# Patient Record
Sex: Female | Born: 1960 | Race: White | Hispanic: No | Smoking: Former smoker
Health system: Southern US, Community
[De-identification: ages and names within clinical notes are randomized; demographics above are authoritative.]

## PROBLEM LIST (undated history)

## (undated) ENCOUNTER — Ambulatory Visit: Admission: EM | Payer: 59

## (undated) DIAGNOSIS — Z860100 Personal history of colon polyps, unspecified: Secondary | ICD-10-CM

## (undated) DIAGNOSIS — K219 Gastro-esophageal reflux disease without esophagitis: Secondary | ICD-10-CM

## (undated) DIAGNOSIS — M5412 Radiculopathy, cervical region: Secondary | ICD-10-CM

## (undated) DIAGNOSIS — I10 Essential (primary) hypertension: Secondary | ICD-10-CM

## (undated) DIAGNOSIS — M47814 Spondylosis without myelopathy or radiculopathy, thoracic region: Secondary | ICD-10-CM

## (undated) DIAGNOSIS — E119 Type 2 diabetes mellitus without complications: Secondary | ICD-10-CM

## (undated) DIAGNOSIS — J342 Deviated nasal septum: Secondary | ICD-10-CM

## (undated) DIAGNOSIS — M47816 Spondylosis without myelopathy or radiculopathy, lumbar region: Secondary | ICD-10-CM

## (undated) DIAGNOSIS — E785 Hyperlipidemia, unspecified: Secondary | ICD-10-CM

## (undated) DIAGNOSIS — N83201 Unspecified ovarian cyst, right side: Secondary | ICD-10-CM

## (undated) DIAGNOSIS — A539 Syphilis, unspecified: Secondary | ICD-10-CM

## (undated) HISTORY — DX: Hyperlipidemia, unspecified: E78.5

## (undated) HISTORY — PX: NECK SURGERY: SHX720

## (undated) HISTORY — DX: Type 2 diabetes mellitus without complications: E11.9

## (undated) HISTORY — DX: Essential (primary) hypertension: I10

## (undated) HISTORY — PX: SHOULDER SURGERY: SHX246

## (undated) HISTORY — DX: Personal history of colon polyps, unspecified: Z86.0100

## (undated) HISTORY — DX: Personal history of colonic polyps: Z86.010

---

## 1980-03-27 HISTORY — PX: DILATION AND CURETTAGE OF UTERUS: SHX78

## 1983-03-28 HISTORY — PX: TUBAL LIGATION: SHX77

## 1990-03-27 DIAGNOSIS — A159 Respiratory tuberculosis unspecified: Secondary | ICD-10-CM

## 1990-03-27 HISTORY — DX: Respiratory tuberculosis unspecified: A15.9

## 1994-03-27 HISTORY — PX: KNEE ARTHROSCOPY: SUR90

## 1998-03-27 HISTORY — PX: OVARIAN CYST REMOVAL: SHX89

## 1998-03-27 HISTORY — PX: RIGHT OOPHORECTOMY: SHX2359

## 1998-05-10 HISTORY — PX: CERVICAL SPINE SURGERY: SHX589

## 2016-03-27 DIAGNOSIS — K5130 Ulcerative (chronic) rectosigmoiditis without complications: Secondary | ICD-10-CM

## 2016-03-27 HISTORY — DX: Ulcerative (chronic) rectosigmoiditis without complications: K51.30

## 2017-03-27 HISTORY — PX: HIP ARTHROSCOPY W/ LABRAL REPAIR: SHX1750

## 2018-03-06 DIAGNOSIS — E669 Obesity, unspecified: Secondary | ICD-10-CM | POA: Insufficient documentation

## 2018-03-27 HISTORY — PX: SHOULDER ARTHROSCOPY WITH BICEPSTENOTOMY: SHX6204

## 2019-03-28 HISTORY — PX: SHOULDER ARTHROSCOPY WITH BICEPSTENOTOMY: SHX6204

## 2019-05-28 HISTORY — PX: COLONOSCOPY: SHX174

## 2020-11-17 ENCOUNTER — Other Ambulatory Visit: Payer: Self-pay | Admitting: *Deleted

## 2020-11-17 ENCOUNTER — Other Ambulatory Visit: Payer: Self-pay | Admitting: Family Medicine

## 2020-11-17 ENCOUNTER — Inpatient Hospital Stay
Admission: RE | Admit: 2020-11-17 | Discharge: 2020-11-17 | Disposition: A | Discharge status: Home / Self Care | Payer: Self-pay | Source: Ambulatory Visit | Attending: *Deleted | Admitting: *Deleted

## 2020-11-17 DIAGNOSIS — Z9289 Personal history of other medical treatment: Secondary | ICD-10-CM

## 2020-11-17 DIAGNOSIS — N6452 Nipple discharge: Secondary | ICD-10-CM

## 2020-11-30 ENCOUNTER — Other Ambulatory Visit: Payer: Self-pay

## 2020-11-30 ENCOUNTER — Ambulatory Visit
Admission: RE | Admit: 2020-11-30 | Discharge: 2020-11-30 | Disposition: A | Discharge status: Home / Self Care | Payer: 59 | Source: Ambulatory Visit | Attending: Family Medicine | Admitting: Family Medicine

## 2020-11-30 DIAGNOSIS — N6452 Nipple discharge: Secondary | ICD-10-CM | POA: Diagnosis not present

## 2020-11-30 MED ORDER — GADOBUTROL 1 MMOL/ML IV SOLN
6.0000 mL | Freq: Once | INTRAVENOUS | Status: AC | PRN
  Administered 2020-11-30: 6 mL via INTRAVENOUS

## 2020-12-28 ENCOUNTER — Ambulatory Visit (INDEPENDENT_AMBULATORY_CARE_PROVIDER_SITE_OTHER): Payer: 59 | Admitting: Family Medicine

## 2020-12-28 ENCOUNTER — Other Ambulatory Visit: Payer: Self-pay

## 2020-12-28 ENCOUNTER — Encounter: Payer: Self-pay | Admitting: Family Medicine

## 2020-12-28 VITALS — BP 120/74 | HR 83 | Ht 64.0 in | Wt 150.0 lb

## 2020-12-28 DIAGNOSIS — M5416 Radiculopathy, lumbar region: Secondary | ICD-10-CM

## 2020-12-28 DIAGNOSIS — M47814 Spondylosis without myelopathy or radiculopathy, thoracic region: Secondary | ICD-10-CM | POA: Diagnosis not present

## 2020-12-28 DIAGNOSIS — G8929 Other chronic pain: Secondary | ICD-10-CM | POA: Insufficient documentation

## 2020-12-28 DIAGNOSIS — M7918 Myalgia, other site: Secondary | ICD-10-CM

## 2020-12-28 MED ORDER — DULOXETINE HCL 30 MG PO CPEP: ORAL_CAPSULE | ORAL | 0 refills | Status: DC

## 2020-12-28 NOTE — Patient Instructions (Signed)
-   Start duloxetine, dose 30 mg (1 capsule) nightly x2 weeks - If tolerating well, increase to 60 mg (2 capsules) nightly x2 weeks - Contact our office at the 4-week mark to provide a status update via MyChart or for any questions between now and then - The referral coordinator will contact you to schedule follow-up with pain and spine group to discuss additional treatments (injections, etc.) - We will coordinate a follow-up with our group ideally after you have seen pain and spine

## 2020-12-28 NOTE — Assessment & Plan Note (Signed)
See additional assessment(s) for plan details. 

## 2020-12-28 NOTE — Progress Notes (Signed)
Primary Care / Sports Medicine Office Visit  Patient Information:  Patient ID: Molly Cooke, female DOB: Mar 20, 1961 Age: 60 y.o. MRN: [id number]   Molly Cooke is a pleasant 60 y.o. female presenting with the following:  Chief Complaint  Patient presents with   New Patient (Initial Visit)   Back Pain    Since fall in 2019; did PT for 3 years and was getting epidural injections with [facility] (Dr. Joycelyn Man) with relief; had to switch providers due to insurance change; thoracolumbar constant pain that radiates to left hip; 8/10 pain    Review of Systems pertinent details above   Patient Active Problem List   Diagnosis Date Noted   Thoracic spondylosis without myelopathy 12/28/2020   Chronic musculoskeletal pain 12/28/2020   Chronic left-sided lumbar radiculopathy 12/28/2020   Obesity (BMI 30-39.9) 03/06/2018   Past Medical History:  Diagnosis Date   Diabetes mellitus without complication (HCC)    Hyperlipidemia    Hypertension    Outpatient Encounter Medications as of 12/28/2020  Medication Sig   aspirin 81 MG chewable tablet Chew 81 mg by mouth daily.   atorvastatin (LIPITOR) 10 MG tablet Take 10 mg by mouth at bedtime.   baclofen (LIORESAL) 10 MG tablet Take 10 mg by mouth 3 (three) times daily.   benazepril (LOTENSIN) 40 MG tablet Take 40 mg by mouth daily.   diclofenac Sodium (VOLTAREN) 1 % GEL Apply 2 g topically as needed.   DULoxetine (CYMBALTA) 30 MG capsule Take 1 capsule (30 mg total) by mouth every evening for 14 days, THEN 2 capsules (60 mg total) every evening for 16 days.   glipiZIDE (GLUCOTROL XL) 2.5 MG 24 hr tablet Take 2.5 mg by mouth every morning.   LORazepam (ATIVAN) 0.5 MG tablet Take 0.5 mg by mouth at bedtime.   mesalamine (CANASA) 1000 MG suppository Place 1,000 mg rectally at bedtime as needed.   omeprazole (PRILOSEC) 40 MG capsule Take 40 mg by mouth daily.   OZEMPIC, 1 MG/DOSE, 4 MG/3ML SOPN Inject 1 mg into the skin once a week.    [DISCONTINUED] sulfaSALAzine (AZULFIDINE) 500 MG tablet Take 500 mg by mouth daily.   gabapentin (NEURONTIN) 100 MG capsule Take 100 mg by mouth every 6 (six) hours as needed. (Patient not taking: Reported on 12/28/2020)   mesalamine (LIALDA) 1.2 g EC tablet Take 1.2 g by mouth daily. (Patient not taking: Reported on 12/28/2020)   metFORMIN (GLUCOPHAGE) 1000 MG tablet Take 1,000 mg by mouth in the morning and at bedtime. (Patient not taking: Reported on 12/28/2020)   traMADol (ULTRAM) 50 MG tablet Take 50 mg by mouth every 6 (six) hours as needed. (Patient not taking: Reported on 12/28/2020)   [DISCONTINUED] chlorzoxazone (PARAFON) 500 MG tablet Take 500 mg by mouth 3 (three) times daily as needed.   No facility-administered encounter medications on file as of 12/28/2020.   Past Surgical History:  Procedure Laterality Date   HIP ARTHROSCOPY W/ LABRAL REPAIR Left 2019   KNEE ARTHROSCOPY Right 1996   OVARIAN CYST REMOVAL Right 2000   SHOULDER ARTHROSCOPY WITH BICEPSTENOTOMY Left 2020   SHOULDER ARTHROSCOPY WITH BICEPSTENOTOMY Left 2021   TUBAL LIGATION  1985    Vitals:   12/28/20 1521  BP: 120/74  Pulse: 83  SpO2: 97%   Vitals:   12/28/20 1521  Weight: 150 lb (68 kg)  Height: 5\' 4"  (1.626 m)   Body mass index is 25.75 kg/m.     Independent interpretation of notes and  tests performed by another provider:   None  Procedures performed:   None  Pertinent History, Exam, Impression, and Recommendations:   Thoracic spondylosis without myelopathy Patient with chronic thoracolumbar pain in the setting of multilevel degenerative changes outlined during outside record review. She has been receiving regular thoracic epidural injections with moderate response and most recently, 01/2020 timeframe, received multiple thoracic facet injections which have afforded several months of symptoms control. She had to discontinue outside pain & spine group due to insurance change and is interested in  reestablishing with another group.  Her examination reveals full range of motion at the thoracic and lumbar spine, pain during rightward torsion, negative axial loading, tenderness left greater than right paraspinal thoracic, otherwise non-tender, positive seated straight leg raise on left.  I have advised treatment strategies and she is amenable to referral to pain & spine group, we placed this today, additionally we initiated duloxetine at 30 mg QHS with titration to 60 mg QHS in 2 weeks if tolerating well. She is to provide a status update at that time for further Rx instructions.  Chronic musculoskeletal pain See additional assessment(s) for plan details.  Chronic left-sided lumbar radiculopathy See additional assessment(s) for plan details.   Orders & Medications Meds ordered this encounter  Medications   DULoxetine (CYMBALTA) 30 MG capsule    Sig: Take 1 capsule (30 mg total) by mouth every evening for 14 days, THEN 2 capsules (60 mg total) every evening for 16 days.    Dispense:  46 capsule    Refill:  0   Orders Placed This Encounter  Procedures   Ambulatory referral to Pain Clinic     Return if symptoms worsen or fail to improve.     Jerrol Banana, MD   Primary Care Sports Medicine [facility]

## 2020-12-28 NOTE — Assessment & Plan Note (Signed)
Patient with chronic thoracolumbar pain in the setting of multilevel degenerative changes outlined during outside record review. She has been receiving regular thoracic epidural injections with moderate response and most recently, 01/2020 timeframe, received multiple thoracic facet injections which have afforded several months of symptoms control. She had to discontinue outside pain & spine group due to insurance change and is interested in reestablishing with another group.  Her examination reveals full range of motion at the thoracic and lumbar spine, pain during rightward torsion, negative axial loading, tenderness left greater than right paraspinal thoracic, otherwise non-tender, positive seated straight leg raise on left.  I have advised treatment strategies and she is amenable to referral to pain & spine group, we placed this today, additionally we initiated duloxetine at 30 mg QHS with titration to 60 mg QHS in 2 weeks if tolerating well. She is to provide a status update at that time for further Rx instructions.

## 2021-01-03 ENCOUNTER — Other Ambulatory Visit: Payer: Self-pay

## 2021-01-03 DIAGNOSIS — G8929 Other chronic pain: Secondary | ICD-10-CM

## 2021-01-03 DIAGNOSIS — M5416 Radiculopathy, lumbar region: Secondary | ICD-10-CM

## 2021-01-03 DIAGNOSIS — M47814 Spondylosis without myelopathy or radiculopathy, thoracic region: Secondary | ICD-10-CM

## 2021-03-15 ENCOUNTER — Other Ambulatory Visit: Payer: Self-pay

## 2021-03-15 ENCOUNTER — Encounter: Payer: Self-pay | Admitting: Student in an Organized Health Care Education/Training Program

## 2021-03-15 ENCOUNTER — Ambulatory Visit
Payer: 59 | Attending: Student in an Organized Health Care Education/Training Program | Admitting: Student in an Organized Health Care Education/Training Program

## 2021-03-15 VITALS — BP 158/101 | HR 81 | Temp 97.2°F | Resp 16 | Ht 63.0 in | Wt 150.0 lb

## 2021-03-15 DIAGNOSIS — G8929 Other chronic pain: Secondary | ICD-10-CM

## 2021-03-15 DIAGNOSIS — G894 Chronic pain syndrome: Secondary | ICD-10-CM

## 2021-03-15 DIAGNOSIS — M47814 Spondylosis without myelopathy or radiculopathy, thoracic region: Secondary | ICD-10-CM | POA: Diagnosis present

## 2021-03-15 DIAGNOSIS — M7918 Myalgia, other site: Secondary | ICD-10-CM | POA: Insufficient documentation

## 2021-03-15 NOTE — Progress Notes (Signed)
Patient: Molly Cooke  Service Category: E/M  Provider: Gillis Santa, MD  DOB: 1960/06/27  DOS: 03/15/2021  Referring Provider: Montel Culver, MD  MRN: [id number]  Setting: Ambulatory outpatient  PCP: Tor Netters, MD  Type: New Patient  Specialty: Interventional Pain Management    Location: Office  Delivery: Face-to-face     Primary Reason(s) for Visit: Encounter for initial evaluation of one or more chronic problems (new to examiner) potentially causing chronic pain, and posing a threat to normal musculoskeletal function. (Level of risk: High) CC: Back Pain (lower)  HPI  Molly Cooke is a 60 y.o. year old, female patient, who comes for the first time to our practice referred by Zigmund Daniel Earley Abide, MD for our initial evaluation of her chronic pain. She has Obesity (BMI 30-39.9); Arthropathy of thoracic facet joint; Chronic musculoskeletal pain; and Chronic left-sided lumbar radiculopathy on their problem list. Today she comes in for evaluation of her Back Pain (lower)  Pain Assessment: Location: Lower Back Radiating: left buttock Onset: More than a month ago Duration: Chronic pain Quality: Sharp, Aching Severity: 7 /10 (subjective, self-reported pain score)  Effect on ADL: sometimes cannot get out of bed, requires frequent, long periods of rest, limited daily activities Timing: Constant Modifying factors: lying on heat, Delta 8 (cannibas), injections BP: (!) 158/101   HR: 81  Onset and Duration: Sudden and Present longer than 3 months Cause of pain: Work related accident or event Severity: NAS-11 at its worse: 10/10, NAS-11 at its best: 2/10, NAS-11 now: 10/10, and NAS-11 on the average: 8/10 Timing: Not influenced by the time of the day, During activity or exercise, and After activity or exercise Aggravating Factors: Bending, Climbing, Kneeling, Lifiting, Motion, Prolonged sitting, Prolonged standing, Squatting, Stooping , Twisting, Walking, Walking uphill, Walking downhill, and  Working Alleviating Factors: Cold packs, Hot packs, Lying down, Nerve blocks, Resting, and Warm showers or baths Associated Problems: Inability to concentrate, Personality changes, Sadness, Tingling, Weakness, Pain that wakes patient up, and Pain that does not allow patient to sleep Quality of Pain: Aching, Agonizing, Burning, Deep, Disabling, Exhausting, Horrible, Nagging, Sharp, Stabbing, Tender, Throbbing, Tingling, Tiring, and Work related Previous Examinations or Tests: MRI scan, Nerve block, X-rays, and Nerve conduction test Previous Treatments: Chiropractic manipulations, Epidural steroid injections, Narcotic medications, Physical Therapy, Pool exercises, Relaxation therapy, Steroid treatments by mouth, Stretching exercises, TENS, Traction, and Trigger point injections  Molly Cooke is a pleasant 60 year old female who presents with mid and low back pain related to thoracic facet joint syndrome, thoracic spondylosis.  This started back in 2019 when she sustained a fall.  She has done physical therapy for 3 years in the past and tries to continue stretching exercises at home.  She has constant thoracolumbar pain that occasionally radiates into her left hip.  She has been seen evaluated by [facility] in the past.  X-rays revealed thoracic facet arthropathy and degenerative joint disease.  She has found benefit from thoracic facet joint injections in the past with Dr. Tacy Dura at [facility].  She states that these provided 4 to 6 months of pain relief and is hoping to have these repeated.  She utilizes delta H to help manage her pain.  She states that she has issues with memory and recall.  She states that the delta agent does help her pain.  She tries to do stretching exercises at home and has tried a TENS unit in the past.  She also utilizes baclofen and Voltaren gel.  Trial of Cymbalta which she discontinued.  She has also tried gabapentin in the past 100 mg every 6 hours with limited response.  Meds    Current Outpatient Medications:    atorvastatin (LIPITOR) 10 MG tablet, Take 10 mg by mouth at bedtime., Disp: , Rfl:    baclofen (LIORESAL) 10 MG tablet, Take 10 mg by mouth 3 (three) times daily., Disp: , Rfl:    benazepril (LOTENSIN) 40 MG tablet, Take 40 mg by mouth daily., Disp: , Rfl:    diclofenac Sodium (VOLTAREN) 1 % GEL, Apply 2 g topically as needed., Disp: , Rfl:    glipiZIDE (GLUCOTROL XL) 2.5 MG 24 hr tablet, Take 2.5 mg by mouth every morning., Disp: , Rfl:    LORazepam (ATIVAN) 0.5 MG tablet, Take 0.5 mg by mouth at bedtime., Disp: , Rfl:    mesalamine (CANASA) 1000 MG suppository, Place 1,000 mg rectally at bedtime as needed., Disp: , Rfl:    metFORMIN (GLUCOPHAGE) 1000 MG tablet, Take 1,000 mg by mouth in the morning and at bedtime., Disp: , Rfl:    omeprazole (PRILOSEC) 40 MG capsule, Take 40 mg by mouth daily., Disp: , Rfl:    OZEMPIC, 1 MG/DOSE, 4 MG/3ML SOPN, Inject 1 mg into the skin once a week., Disp: , Rfl:    DULoxetine (CYMBALTA) 30 MG capsule, Take 1 capsule (30 mg total) by mouth every evening for 14 days, THEN 2 capsules (60 mg total) every evening for 16 days., Disp: 46 capsule, Rfl: 0   gabapentin (NEURONTIN) 100 MG capsule, Take 100 mg by mouth every 6 (six) hours as needed. (Patient not taking: Reported on 12/28/2020), Disp: , Rfl:    mesalamine (LIALDA) 1.2 g EC tablet, Take 1.2 g by mouth daily. (Patient not taking: Reported on 12/28/2020), Disp: , Rfl:    traMADol (ULTRAM) 50 MG tablet, Take 50 mg by mouth every 6 (six) hours as needed. (Patient not taking: Reported on 12/28/2020), Disp: , Rfl:   Imaging Review  Thoracic x-ray note from [facility] reveals thoracic facet joint syndrome most pronounced at T8-T9 and T9-T10 bilaterally. Complexity Note: Imaging results reviewed. Results shared with Molly Cooke, using Layman's terms.                         ROS  Cardiovascular: High blood pressure Pulmonary or Respiratory: Exposure to  tuberculosis Neurological: No reported neurological signs or symptoms such as seizures, abnormal skin sensations, urinary and/or fecal incontinence, being born with an abnormal open spine and/or a tethered spinal cord Psychological-Psychiatric: Anxiousness Gastrointestinal: Vomiting blood (Ulcers) and Reflux or heatburn Genitourinary: No reported renal or genitourinary signs or symptoms such as difficulty voiding or producing urine, peeing blood, non-functioning kidney, kidney stones, difficulty emptying the bladder, difficulty controlling the flow of urine, or chronic kidney disease Hematological: No reported hematological signs or symptoms such as prolonged bleeding, low or poor functioning platelets, bruising or bleeding easily, hereditary bleeding problems, low energy levels due to low hemoglobin or being anemic Endocrine: High blood sugar controlled without the use of insulin (NIDDM) Rheumatologic: No reported rheumatological signs and symptoms such as fatigue, joint pain, tenderness, swelling, redness, heat, stiffness, decreased range of motion, with or without associated rash Musculoskeletal: Negative for myasthenia gravis, muscular dystrophy, multiple sclerosis or malignant hyperthermia Work History: Disabled and Out of work due to pain  Allergies  Ms. Ohlrich is allergic to hydrocodone-acetaminophen, nsaids, oxycodone-acetaminophen, clemastine, and hydromorphone.  Laboratory Chemistry Profile   Renal No results found for: BUN, CREATININE, LABCREA, BCR, GFR,  GFRAA, GFRNONAA, SPECGRAV, PHUR, PROTEINUR   Electrolytes No results found for: NA, K, CL, CALCIUM, MG, PHOS   Hepatic No results found for: AST, ALT, ALBUMIN, ALKPHOS, AMYLASE, LIPASE, AMMONIA   ID No results found for: LYMEIGGIGMAB, HIV, SARSCOV2NAA, STAPHAUREUS, MRSAPCR, HCVAB, PREGTESTUR, RMSFIGG, QFVRPH1IGG, QFVRPH2IGG, LYMEIGGIGMAB   Bone No results found for: VD25OH, [id number], [id number], [id number], 25OHVITD1, 25OHVITD2,  25OHVITD3, TESTOFREE, TESTOSTERONE   Endocrine No results found for: GLUCOSE, GLUCOSEU, HGBA1C, TSH, FREET4, TESTOFREE, TESTOSTERONE, SHBG, ESTRADIOL, ESTRADIOLPCT, ESTRADIOLFRE, LABPREG, ACTH, CRTSLPL, UCORFRPERLTR, UCORFRPERDAY, CORTISOLBASE, LABPREG   Neuropathy No results found for: VITAMINB12, FOLATE, HGBA1C, HIV   CNS No results found for: COLORCSF, APPEARCSF, RBCCOUNTCSF, WBCCSF, POLYSCSF, LYMPHSCSF, EOSCSF, PROTEINCSF, GLUCCSF, JCVIRUS, CSFOLI, IGGCSF, LABACHR, ACETBL, LABACHR, ACETBL   Inflammation (CRP: Acute   ESR: Chronic) No results found for: CRP, ESRSEDRATE, LATICACIDVEN   Rheumatology No results found for: RF, ANA, LABURIC, URICUR, LYMEIGGIGMAB, LYMEABIGMQN, HLAB27   Coagulation No results found for: INR, LABPROT, APTT, PLT, DDIMER, LABHEMA, VITAMINK1, AT3   Cardiovascular No results found for: BNP, CKTOTAL, CKMB, TROPONINI, HGB, HCT, LABVMA, EPIRU, EPINEPH24HUR, NOREPRU, NOREPI24HUR, DOPARU, DOPAM24HRUR   Screening No results found for: SARSCOV2NAA, COVIDSOURCE, STAPHAUREUS, MRSAPCR, HCVAB, HIV, PREGTESTUR   Cancer No results found for: CEA, CA125, LABCA2   Allergens No results found for: ALMOND, APPLE, ASPARAGUS, AVOCADO, BANANA, BARLEY, BASIL, BAYLEAF, GREENBEAN, LIMABEAN, WHITEBEAN, BEEFIGE, REDBEET, BLUEBERRY, BROCCOLI, CABBAGE, MELON, CARROT, CASEIN, CASHEWNUT, CAULIFLOWER, CELERY     Note: Lab results reviewed.  PFSH  Drug: Ms. Beaufort  reports no history of drug use. Alcohol:  reports current alcohol use of about 2.0 standard drinks per week. Tobacco:  reports that she quit smoking about 14 years ago. Her smoking use included cigarettes. She has never used smokeless tobacco. Medical:  has a past medical history of Diabetes mellitus without complication ([place]), Hyperlipidemia, and Hypertension. Family: family history includes Dementia in her mother.  Past Surgical History:  Procedure Laterality Date   HIP ARTHROSCOPY W/ LABRAL REPAIR Left 2019   KNEE  ARTHROSCOPY Right 1996   OVARIAN CYST REMOVAL Right 2000   SHOULDER ARTHROSCOPY WITH BICEPSTENOTOMY Left 2020   SHOULDER ARTHROSCOPY WITH BICEPSTENOTOMY Left 2021   TUBAL LIGATION  1985   Active Ambulatory Problems    Diagnosis Date Noted   Obesity (BMI 30-39.9) 03/06/2018   Arthropathy of thoracic facet joint 12/28/2020   Chronic musculoskeletal pain 12/28/2020   Chronic left-sided lumbar radiculopathy 12/28/2020   Resolved Ambulatory Problems    Diagnosis Date Noted   No Resolved Ambulatory Problems   Past Medical History:  Diagnosis Date   Diabetes mellitus without complication ([place])    Hyperlipidemia    Hypertension    Constitutional Exam  General appearance: Well nourished, well developed, and well hydrated. In no apparent acute distress Vitals:   03/15/21 0858 03/15/21 0859  BP: (!) 169/111 (!) 158/101  Pulse: 81   Resp: 16   Temp: (!) 97.2 F (36.2 C)   TempSrc: Temporal   SpO2: 100%   Weight: 150 lb (68 kg)   Height: 5' 3" (1.6 m)    BMI Assessment: Estimated body mass index is 26.57 kg/m as calculated from the following:   Height as of this encounter: 5' 3" (1.6 m).   Weight as of this encounter: 150 lb (68 kg).  BMI interpretation table: BMI level Category Range association with higher incidence of chronic pain  <18 kg/m2 Underweight   18.5-24.9 kg/m2 Ideal body weight   25-29.9 kg/m2 Overweight Increased incidence by 20%  30-34.9 kg/m2 Obese (Class I) Increased incidence by 68%  35-39.9 kg/m2 Severe obesity (Class II) Increased incidence by 136%  >40 kg/m2 Extreme obesity (Class III) Increased incidence by 254%   Patient's current BMI Ideal Body weight  Body mass index is 26.57 kg/m. Ideal body weight: 52.4 kg (115 lb 8.3 oz) Adjusted ideal body weight: 58.7 kg (129 lb 5 oz)   BMI Readings from Last 4 Encounters:  03/15/21 26.57 kg/m  12/28/20 25.75 kg/m   Wt Readings from Last 4 Encounters:  03/15/21 150 lb (68 kg)  12/28/20 150 lb (68 kg)     Psych/Mental status: Alert, oriented x 3 (person, place, & time)       Eyes: PERLA Respiratory: No evidence of acute respiratory distress  Thoracic Spine Area Exam  Skin & Axial Inspection: No masses, redness, or swelling Alignment: Symmetrical Functional ROM: Pain restricted ROM Stability: No instability detected Muscle Tone/Strength: Functionally intact. No obvious neuro-muscular anomalies detected. Sensory (Neurological): Musculoskeletal pain pattern Muscle strength & Tone: No palpable anomalies Lumbar Spine Area Exam  Skin & Axial Inspection: No masses, redness, or swelling Alignment: Symmetrical Functional ROM: Pain restricted ROM       Stability: No instability detected Muscle Tone/Strength: Functionally intact. No obvious neuro-muscular anomalies detected. Sensory (Neurological): Musculoskeletal pain pattern  Gait & Posture Assessment  Ambulation: Unassisted Gait: Relatively normal for age and body habitus Posture: WNL  Lower Extremity Exam    Side: Right lower extremity  Side: Left lower extremity  Stability: No instability observed          Stability: No instability observed          Skin & Extremity Inspection: Skin color, temperature, and hair growth are WNL. No peripheral edema or cyanosis. No masses, redness, swelling, asymmetry, or associated skin lesions. No contractures.  Skin & Extremity Inspection: Skin color, temperature, and hair growth are WNL. No peripheral edema or cyanosis. No masses, redness, swelling, asymmetry, or associated skin lesions. No contractures.  Functional ROM: Unrestricted ROM                  Functional ROM: Unrestricted ROM                  Muscle Tone/Strength: Functionally intact. No obvious neuro-muscular anomalies detected.  Muscle Tone/Strength: Functionally intact. No obvious neuro-muscular anomalies detected.  Sensory (Neurological): Unimpaired        Sensory (Neurological): Unimpaired        DTR: Patellar: deferred  today Achilles: deferred today Plantar: deferred today  DTR: Patellar: deferred today Achilles: deferred today Plantar: deferred today  Palpation: No palpable anomalies  Palpation: No palpable anomalies    Assessment  Primary Diagnosis & Pertinent Problem List: The primary encounter diagnosis was Thoracic spondylosis without myelopathy. Diagnoses of Arthropathy of thoracic facet joint, Chronic musculoskeletal pain, and Chronic pain syndrome were also pertinent to this visit.  Visit Diagnosis (New problems to examiner): 1. Thoracic spondylosis without myelopathy   2. Arthropathy of thoracic facet joint   3. Chronic musculoskeletal pain   4. Chronic pain syndrome    Plan of Care (Initial workup plan)  Thoracic spondylosis without myelopathy - Plan: THORACIC FACET BLOCK  Arthropathy of thoracic facet joint - Plan: THORACIC FACET BLOCK  Chronic musculoskeletal pain  Chronic pain syndrome - Plan: THORACIC FACET BLOCK   Procedure Orders         THORACIC FACET BLOCK     Discussed thoracic facet medial branch nerve block which the patient has  had in the past that provided 4 to 6 months of pain relief.  Patient provide [place] procedural records from Dr. Garth Schlatter office detailing procedural history.  We will plan on doing this under fluoroscopy.  Also discussed radiofrequency ablation of the thoracic medial branch nerves based upon response from thoracic medial branch nerve blocks.  Provider-requested follow-up: Return in about 15 days (around 03/30/2021) for Thoracic Fct Block , without sedation.  I spent a total of 60 minutes reviewing chart data, face-to-face evaluation with the patient, counseling and coordination of care as detailed above.   No future appointments.  Note by: Gillis Santa, MD Date: 03/15/2021; Time: 9:30 AM

## 2021-03-15 NOTE — Patient Instructions (Signed)
____________________________________________________________________________________________  Preparing for your procedure (without sedation)  Procedure appointments are limited to planned procedures: . No Prescription Refills. . No disability issues will be discussed. . No medication changes will be discussed.  Instructions: . Oral Intake: Do not eat or drink anything for at least 6 hours prior to your procedure. (Exception: Blood Pressure Medication. See below.) . Transportation: Unless otherwise stated by your physician, you may drive yourself after the procedure. . Blood Pressure Medicine: Do not forget to take your blood pressure medicine with a sip of water the morning of the procedure. If your Diastolic (lower reading)is above 100 mmHg, elective cases will be cancelled/rescheduled. . Blood thinners: These will need to be stopped for procedures. Notify our staff if you are taking any blood thinners. Depending on which one you take, there will be specific instructions on how and when to stop it. . Diabetics on insulin: Notify the staff so that you can be scheduled 1st case in the morning. If your diabetes requires high dose insulin, take only  of your normal insulin dose the morning of the procedure and notify the staff that you have done so. . Preventing infections: Shower with an antibacterial soap the morning of your procedure.  . Build-up your immune system: Take 1000 mg of Vitamin C with every meal (3 times a day) the day prior to your procedure. . Antibiotics: Inform the staff if you have a condition or reason that requires you to take antibiotics before dental procedures. . Pregnancy: If you are pregnant, call and cancel the procedure. . Sickness: If you have a cold, fever, or any active infections, call and cancel the procedure. . Arrival: You must be in the facility at least 30 minutes prior to your scheduled procedure. . Children: Do not bring any children with you. . Dress  appropriately: Bring dark clothing that you would not mind if they get stained. . Valuables: Do not bring any jewelry or valuables.  Reasons to call and reschedule or cancel your procedure: (Following these recommendations will minimize the risk of a serious complication.) . Surgeries: Avoid having procedures within 2 weeks of any surgery. (Avoid for 2 weeks before or after any surgery). . Flu Shots: Avoid having procedures within 2 weeks of a flu shots or . (Avoid for 2 weeks before or after immunizations). . Barium: Avoid having a procedure within 7-10 days after having had a radiological study involving the use of radiological contrast. (Myelograms, Barium swallow or enema study). . Heart attacks: Avoid any elective procedures or surgeries for the initial 6 months after a "Myocardial Infarction" (Heart Attack). . Blood thinners: It is imperative that you stop these medications before procedures. Let us know if you if you take any blood thinner.  . Infection: Avoid procedures during or within two weeks of an infection (including chest colds or gastrointestinal problems). Symptoms associated with infections include: Localized redness, fever, chills, night sweats or profuse sweating, burning sensation when voiding, cough, congestion, stuffiness, runny nose, sore throat, diarrhea, nausea, vomiting, cold or Flu symptoms, recent or current infections. It is specially important if the infection is over the area that we intend to treat. . Heart and lung problems: Symptoms that may suggest an active cardiopulmonary problem include: cough, chest pain, breathing difficulties or shortness of breath, dizziness, ankle swelling, uncontrolled high or unusually low blood pressure, and/or palpitations. If you are experiencing any of these symptoms, cancel your procedure and contact your primary care physician for an evaluation.  Remember:  Regular   Business hours are:  Monday to Thursday 8:00 AM to 4:00  PM  Provider's Schedule: Molly Naveira, MD:  Procedure days: Tuesday and Thursday 7:30 AM to 4:00 PM  Molly Lateef, MD:  Procedure days: Monday and Wednesday 7:30 AM to 4:00 PM ____________________________________________________________________________________________    

## 2021-03-15 NOTE — Progress Notes (Signed)
Safety precautions to be maintained throughout the outpatient stay will include: orient to surroundings, keep bed in low position, maintain call bell within reach at all times, provide assistance with transfer out of bed and ambulation.  

## 2021-03-30 ENCOUNTER — Encounter: Payer: Self-pay | Admitting: Student in an Organized Health Care Education/Training Program

## 2021-03-30 ENCOUNTER — Other Ambulatory Visit: Payer: Self-pay

## 2021-03-30 ENCOUNTER — Ambulatory Visit (HOSPITAL_BASED_OUTPATIENT_CLINIC_OR_DEPARTMENT_OTHER): Payer: 59 | Admitting: Student in an Organized Health Care Education/Training Program

## 2021-03-30 ENCOUNTER — Ambulatory Visit
Admission: RE | Admit: 2021-03-30 | Discharge: 2021-03-30 | Disposition: A | Discharge status: Home / Self Care | Payer: 59 | Source: Ambulatory Visit | Attending: Student in an Organized Health Care Education/Training Program | Admitting: Student in an Organized Health Care Education/Training Program

## 2021-03-30 DIAGNOSIS — G894 Chronic pain syndrome: Secondary | ICD-10-CM | POA: Insufficient documentation

## 2021-03-30 DIAGNOSIS — M47814 Spondylosis without myelopathy or radiculopathy, thoracic region: Secondary | ICD-10-CM | POA: Diagnosis not present

## 2021-03-30 MED ORDER — ROPIVACAINE HCL 2 MG/ML IJ SOLN
INTRAMUSCULAR | Status: AC
  Filled 2021-03-30: qty 20

## 2021-03-30 MED ORDER — LIDOCAINE HCL 2 % IJ SOLN
20.0000 mL | Freq: Once | INTRAMUSCULAR | Status: AC
  Administered 2021-03-30: 400 mg

## 2021-03-30 MED ORDER — DEXAMETHASONE SODIUM PHOSPHATE 10 MG/ML IJ SOLN
10.0000 mg | Freq: Once | INTRAMUSCULAR | Status: AC
  Administered 2021-03-30: 10 mg

## 2021-03-30 MED ORDER — DEXAMETHASONE SODIUM PHOSPHATE 10 MG/ML IJ SOLN
INTRAMUSCULAR | Status: AC
  Filled 2021-03-30: qty 2

## 2021-03-30 MED ORDER — LIDOCAINE HCL 2 % IJ SOLN
INTRAMUSCULAR | Status: AC
  Filled 2021-03-30: qty 20

## 2021-03-30 MED ORDER — ROPIVACAINE HCL 2 MG/ML IJ SOLN
9.0000 mL | Freq: Once | INTRAMUSCULAR | Status: AC
  Administered 2021-03-30: 20 mL via PERINEURAL

## 2021-03-30 NOTE — Progress Notes (Signed)
PROVIDER NOTE: Interpretation of information contained herein should be left to medically-trained personnel. Specific patient instructions are provided elsewhere under "Patient Instructions" section of medical record. This document was created in part using STT-dictation technology, any transcriptional errors that may result from this process are unintentional.  Patient: Molly Cooke Type: Established DOB: 07/21/1960 MRN: [id number] PCP: Babs Bertin, MD  Service: Procedure DOS: 03/30/2021 Setting: Ambulatory Location: Ambulatory outpatient facility Delivery: Face-to-face Provider: Edward Jolly, MD Specialty: Interventional Pain Management Specialty designation: 09 Location: Outpatient facility Ref. Prov.: Edward Jolly, MD    Primary Reason for Visit: Interventional Pain Management Treatment. CC: Back Pain (Left and mid)    Procedure:          Anesthesia, Analgesia, Anxiolysis:  Type: Therapeutic Medial Branch Facet Block  #1  Region:Thoracic Level: T9, T10, T11 Medial Branch Level(s) Laterality: Bilateral  Anesthesia: Local (1-2% Lidocaine)  Anxiolysis: None  Sedation: None  Guidance: Fluoroscopy           Position: Prone   1. Thoracic spondylosis without myelopathy   2. Arthropathy of thoracic facet joint   3. Chronic pain syndrome    NAS-11 Pain score:   Pre-procedure: 8 /10   Post-procedure: 0-No pain/10     Pre-op H&P Assessment:  Ms. Molly Cooke is a 61 y.o. (year old), female patient, seen today for interventional treatment. She  has a past surgical history that includes Shoulder arthroscopy with bicepstenotomy (Left, 2020); Shoulder arthroscopy with bicepstenotomy (Left, 2021); Hip arthroscopy w/ labral repair (Left, 2019); Ovarian cyst removal (Right, 2000); Knee arthroscopy (Right, 1996); and Tubal ligation (1985). Ms. Molly Cooke has a current medication list which includes the following prescription(s): atorvastatin, benazepril, diclofenac sodium, glipizide, lorazepam,  mesalamine, omeprazole, ozempic (1 mg/dose), baclofen, duloxetine, gabapentin, mesalamine, metformin, and tramadol. Her primarily concern today is the Back Pain (Left and mid)  Initial Vital Signs:  Pulse/HCG Rate: 80ECG Heart Rate: 61 Temp:  (!) 97.3 F (36.3 C) Resp: 18 BP: 133/83 SpO2: 100 %  BMI: Estimated body mass index is 25.75 kg/m as calculated from the following:   Height as of this encounter: 5\' 4"  (1.626 m).   Weight as of this encounter: 150 lb (68 kg).  Risk Assessment: Allergies: Reviewed. She is allergic to hydrocodone-acetaminophen, nsaids, oxycodone-acetaminophen, clemastine, and hydromorphone.  Allergy Precautions: None required Coagulopathies: Reviewed. None identified.  Blood-thinner therapy: None at this time Active Infection(s): Reviewed. None identified. Ms. is afebrile  Site Confirmation: Ms. Molly Cooke was asked to confirm the procedure and laterality before marking the site Procedure checklist: Completed Consent: Before the procedure and under the influence of no sedative(s), amnesic(s), or anxiolytics, the patient was informed of the treatment options, risks and possible complications. To fulfill our ethical and legal obligations, as recommended by the American Medical Association's Code of Ethics, I have informed the patient of my clinical impression; the nature and purpose of the treatment or procedure; the risks, benefits, and possible complications of the intervention; the alternatives, including doing nothing; the risk(s) and benefit(s) of the alternative treatment(s) or procedure(s); and the risk(s) and benefit(s) of doing nothing. The patient was provided information about the general risks and possible complications associated with the procedure. These may include, but are not limited to: failure to achieve desired goals, infection, bleeding, organ or nerve damage, allergic reactions, paralysis, and death. In addition, the patient was informed of those  risks and complications associated to Spine-related procedures, such as failure to decrease pain; infection (i.e.: Meningitis, epidural or intraspinal abscess); bleeding (i.e.: epidural  hematoma, subarachnoid hemorrhage, or any other type of intraspinal or peri-dural bleeding); organ or nerve damage (i.e.: Any type of peripheral nerve, nerve root, or spinal cord injury) with subsequent damage to sensory, motor, and/or autonomic systems, resulting in permanent pain, numbness, and/or weakness of one or several areas of the body; allergic reactions; (i.e.: anaphylactic reaction); and/or death. Furthermore, the patient was informed of those risks and complications associated with the medications. These include, but are not limited to: allergic reactions (i.e.: anaphylactic or anaphylactoid reaction(s)); adrenal axis suppression; blood sugar elevation that in diabetics may result in ketoacidosis or comma; water retention that in patients with history of congestive heart failure may result in shortness of breath, pulmonary edema, and decompensation with resultant heart failure; weight gain; swelling or edema; medication-induced neural toxicity; particulate matter embolism and blood vessel occlusion with resultant organ, and/or nervous system infarction; and/or aseptic necrosis of one or more joints. Finally, the patient was informed that Medicine is not an exact science; therefore, there is also the possibility of unforeseen or unpredictable risks and/or possible complications that may result in a catastrophic outcome. The patient indicated having understood very clearly. We have given the patient no guarantees and we have made no promises. Enough time was given to the patient to ask questions, all of which were answered to the patient's satisfaction. Ms. Molly [name] has indicated that she wanted to continue with the procedure. Attestation: I, the ordering provider, attest that I have discussed with the patient the benefits,  risks, side-effects, alternatives, likelihood of achieving goals, and potential problems during recovery for the procedure that I have provided informed consent. Date   Time: 03/30/2021  7:50 AM  Pre-Procedure Preparation:  Monitoring: As per clinic protocol. Respiration, ETCO2, SpO2, BP, heart rate and rhythm monitor placed and checked for adequate function Safety Precautions: Patient was assessed for positional comfort and pressure points before starting the procedure. Time-out: I initiated and conducted the "Time-out" before starting the procedure, as per protocol. The patient was asked to participate by confirming the accuracy of the "Time Out" information. Verification of the correct person, site, and procedure were performed and confirmed by me, the nursing staff, and the patient. "Time-out" conducted as per Joint Commission's Universal Protocol (UP.01.01.01). Time: [id number]  Description of Procedure:          Target Area: For Thoracic Facet Medial Branch, the target is between the top of the transverse processes at the point where the transverse process joints the vertebra and the superior-lateral aspect of the transverse process.  (More lateral towards the superior aspect of the thoracic spine.)  Approach: Paraspinal approach. Area Prepped: Entire Posterior Thoracic Region DuraPrep (Iodine Povacrylex [0.7% available iodine] and Isopropyl Alcohol, 74% w/w) Safety Precautions: Aspiration looking for blood return was conducted prior to all injections. At no point did we inject any substances, as a needle was being advanced. No attempts were made at seeking any paresthesias. Safe injection practices and needle disposal techniques used. Medications properly checked for expiration dates. SDV (single dose vial) medications used. Description of the Procedure: Protocol guidelines were followed. The patient was placed in position over the fluoroscopy table. The target area was identified and the area prepped  in the usual manner. Skin & deeper tissues infiltrated with local anesthetic. Appropriate amount of time allowed to pass for local anesthetics to take effect. The procedure needles were then advanced to the target area. Proper needle placement secured. Negative aspiration confirmed. Solution injected in intermittent fashion, asking for systemic symptoms every  0.5cc of injectate. The needles were then removed and the area cleansed, making sure to leave some of the prepping solution back to take advantage of its long term bactericidal properties.  Bilateral T9, T10, T11 thoracic facet medial branch nerve block  6 cc solution made of 5 cc of 0.2% ropivacaine 1 cc of Decadron 10 mg/cc.  2 cc injected at each level above on the left 6 cc solution made of 5 cc of 0.2% ropivacaine 1 cc of Decadron 10 mg/cc.  2 cc injected at each level above on the right  Total steroid dose equals 20 mg         Vitals:   03/30/21 0753 03/30/21 0835 03/30/21 0840 03/30/21 0843  BP: 133/83 128/74 110/78 125/83  Pulse: 80     Resp: 18 17 14 20   Temp: (!) 97.3 F (36.3 C)     TempSrc: Temporal     SpO2: 100% 95% 94% 97%  Weight: 150 lb (68 kg)     Height: 5\' 4"  (1.626 m)       Start Time: 0833 hrs. End Time: 0843 hrs. Imaging Guidance (Spinal):          Type of Imaging Technique: Fluoroscopy Guidance (Spinal) Indication(s): Assistance in needle guidance and placement for procedures requiring needle placement in or near specific anatomical locations not easily accessible without such assistance. Exposure Time: Please see nurses notes. Contrast: None used. Fluoroscopic Guidance: I was personally present during the use of fluoroscopy. "Tunnel Vision Technique" used to obtain the best possible view of the target area. Parallax error corrected before commencing the procedure. "Direction-depth-direction" technique used to introduce the needle under continuous pulsed fluoroscopy. Once target was reached,  antero-posterior, oblique, and lateral fluoroscopic projection used confirm needle placement in all planes. Images permanently stored in EMR. Interpretation: No contrast injected. I personally interpreted the imaging intraoperatively. Adequate needle placement confirmed in multiple planes. Permanent images saved into the patient's record.  Post-operative Assessment:  Post-procedure Vital Signs:  Pulse/HCG Rate: 8077 Temp:  (!) 97.3 F (36.3 C) Resp: 20 BP: 125/83 SpO2: 97 %  EBL: None  Complications: No immediate post-treatment complications observed by team, or reported by patient.  Note: The patient tolerated the entire procedure well. A repeat set of vitals were taken after the procedure and the patient was kept under observation following institutional policy, for this type of procedure. Post-procedural neurological assessment was performed, showing return to baseline, prior to discharge. The patient was provided with post-procedure discharge instructions, including a section on how to identify potential problems. Should any problems arise concerning this procedure, the patient was given instructions to immediately contact us, at any time, without hesitation. In any case, we plan to contact the patient by telephone for a follow-up status report regarding this interventional procedure.  Comments:  No additional relevant information.  Plan of Care  Orders:  Orders Placed This Encounter  Procedures   DG PAIN CLINIC C-ARM 1-60 MIN NO REPORT    Intraoperative interpretation by procedural physician at [facility].    Standing Status:   Standing    Number of Occurrences:   1    Order Specific Question:   Reason for exam:    Answer:   Assistance in needle guidance and placement for procedures requiring needle placement in or near specific anatomical locations not easily accessible without such assistance.    Medications ordered for procedure: Meds ordered this encounter   Medications   lidocaine (XYLOCAINE) 2 % (with pres)  injection 400 mg   dexamethasone (DECADRON) injection 10 mg   dexamethasone (DECADRON) injection 10 mg   ropivacaine (PF) 2 mg/mL (0.2%) (NAROPIN) injection 9 mL   ropivacaine (PF) 2 mg/mL (0.2%) (NAROPIN) injection 9 mL   Medications administered: We administered lidocaine, dexamethasone, dexamethasone, ropivacaine (PF) 2 mg/mL (0.2%), and ropivacaine (PF) 2 mg/mL (0.2%).  See the medical record for exact dosing, route, and time of administration.  Follow-up plan:   Return in about 4 weeks (around 04/27/2021) for Post Procedure Evaluation, virtual.      Recent Visits Date Type Provider Dept  03/15/21 Office Visit Edward Jolly, MD [facility]  Showing recent visits within past 90 days and meeting all other requirements Today's Visits Date Type Provider Dept  03/30/21 Procedure visit Edward Jolly, MD [facility]  Showing today's visits and meeting all other requirements Future Appointments No visits were found meeting these conditions. Showing future appointments within next 90 days and meeting all other requirements  Disposition: Discharge home  Discharge (Date   Time): 03/30/2021; 0900 hrs.   Primary Care Physician: Babs Bertin, MD Location: [facility] Note by: Edward Jolly, MD Date: 03/30/2021; Time: 8:48 AM  Disclaimer:  Medicine is not an exact science. The only guarantee in medicine is that nothing is guaranteed. It is important to note that the decision to proceed with this intervention was based on the information collected from the patient. The Data and conclusions were drawn from the patient's questionnaire, the interview, and the physical examination. Because the information was provided in large part by the patient, it cannot be guaranteed that it has not been purposely or unconsciously manipulated. Every effort has been made to obtain as much relevant data as possible  for this evaluation. It is important to note that the conclusions that lead to this procedure are derived in large part from the available data. Always take into account that the treatment will also be dependent on availability of resources and existing treatment guidelines, considered by other Pain Management Practitioners as being common knowledge and practice, at the time of the intervention. For Medico-Legal purposes, it is also important to point out that variation in procedural techniques and pharmacological choices are the acceptable norm. The indications, contraindications, technique, and results of the above procedure should only be interpreted and judged by a Board-Certified Interventional Pain Specialist with extensive familiarity and expertise in the same exact procedure and technique.

## 2021-03-30 NOTE — Progress Notes (Signed)
Safety precautions to be maintained throughout the outpatient stay will include: orient to surroundings, keep bed in low position, maintain call bell within reach at all times, provide assistance with transfer out of bed and ambulation.  

## 2021-03-30 NOTE — Patient Instructions (Signed)

## 2021-03-31 ENCOUNTER — Telehealth: Payer: Self-pay

## 2021-03-31 NOTE — Telephone Encounter (Signed)
Post procedure phone call.  LM 

## 2021-04-26 ENCOUNTER — Other Ambulatory Visit: Payer: Self-pay | Admitting: Orthopaedic Surgery

## 2021-04-27 ENCOUNTER — Other Ambulatory Visit: Payer: Self-pay

## 2021-04-27 ENCOUNTER — Ambulatory Visit
Payer: 59 | Attending: Student in an Organized Health Care Education/Training Program | Admitting: Student in an Organized Health Care Education/Training Program

## 2021-04-27 ENCOUNTER — Telehealth: Payer: Self-pay

## 2021-04-27 ENCOUNTER — Encounter: Payer: Self-pay | Admitting: Student in an Organized Health Care Education/Training Program

## 2021-04-27 DIAGNOSIS — M47814 Spondylosis without myelopathy or radiculopathy, thoracic region: Secondary | ICD-10-CM

## 2021-04-27 NOTE — Telephone Encounter (Signed)
Called patient for pre virtual appointment questions for 2nd time and she states she will need to call us back.

## 2021-04-27 NOTE — Progress Notes (Signed)
Patient not available to complete postprocedural assessment.  She will need to reschedule her visit and will be nursing on call for postprocedural evaluation results

## 2021-05-02 ENCOUNTER — Telehealth: Payer: 59 | Admitting: Student in an Organized Health Care Education/Training Program

## 2021-05-03 ENCOUNTER — Other Ambulatory Visit: Payer: Self-pay | Admitting: Orthopaedic Surgery

## 2021-05-04 ENCOUNTER — Ambulatory Visit
Payer: 59 | Attending: Student in an Organized Health Care Education/Training Program | Admitting: Student in an Organized Health Care Education/Training Program

## 2021-05-04 ENCOUNTER — Telehealth: Payer: Self-pay

## 2021-05-04 ENCOUNTER — Encounter: Payer: Self-pay | Admitting: Student in an Organized Health Care Education/Training Program

## 2021-05-04 ENCOUNTER — Other Ambulatory Visit: Payer: Self-pay

## 2021-05-04 DIAGNOSIS — M5412 Radiculopathy, cervical region: Secondary | ICD-10-CM | POA: Diagnosis not present

## 2021-05-04 DIAGNOSIS — M47812 Spondylosis without myelopathy or radiculopathy, cervical region: Secondary | ICD-10-CM | POA: Insufficient documentation

## 2021-05-04 DIAGNOSIS — M542 Cervicalgia: Secondary | ICD-10-CM | POA: Diagnosis not present

## 2021-05-04 DIAGNOSIS — M47814 Spondylosis without myelopathy or radiculopathy, thoracic region: Secondary | ICD-10-CM

## 2021-05-04 DIAGNOSIS — M7918 Myalgia, other site: Secondary | ICD-10-CM | POA: Diagnosis not present

## 2021-05-04 DIAGNOSIS — G8929 Other chronic pain: Secondary | ICD-10-CM

## 2021-05-04 DIAGNOSIS — G894 Chronic pain syndrome: Secondary | ICD-10-CM

## 2021-05-04 NOTE — Assessment & Plan Note (Signed)
Status post bilateral T9, T10, T11 thoracic facet medial branch nerve blocks which provided 90% pain relief [place] her thoracic neuropathic pain.  Repeat as needed, consider radiofrequency ablation in future.

## 2021-05-04 NOTE — Assessment & Plan Note (Addendum)
Was evaluated by orthopedics yesterday.  She is having increased cervical spine pain with paresthesias in bilateral upper extremity.  They recommended a cervical epidural injection.  Orders Placed This Encounter  Procedures   Cervical Epidural Injection    Sedation: without Purpose: Diagnostic/Therapeutic Indication(s): Radiculitis and cervicalgia associater with cervical degenerative disc disease.    Standing Status:   Future    Standing Expiration Date:   08/01/2021    Scheduling Instructions:     Procedure: Cervical Epidural Steroid Injection/Block     Level(s): C7-T1     Laterality: TBD     Timeframe: As soon as schedule allows    Order Specific Question:   Where will this procedure be performed?    Answer:   [facility]    Comments:   Molly Cooke

## 2021-05-04 NOTE — Addendum Note (Signed)
Addended by: Edward Jolly on: 05/04/2021 11:47 AM   Modules accepted: Orders

## 2021-05-04 NOTE — Telephone Encounter (Signed)
Phone call to patient to follow up on procedure (Therapeutic Medial Branch Facet Block) on 03/30/21. 100/100/60% relief.   Patent states that overall she received 60% relief from procedure stating "It felt like pain and pressure for 2 weeks. After 2 weeks nerve pain subsided from back but still having bone pain in the spine". Patient states that now that back is better her pain in the neck seems to worsen and having tingling sensation down arms bilateral. Patient states she would be interested in injections in her neck to see if that would help.   Earlyne Iba, RN

## 2021-05-04 NOTE — Assessment & Plan Note (Signed)
Orders Placed This Encounter  Procedures   Cervical Epidural Injection    Sedation: without Purpose: Diagnostic/Therapeutic Indication(s): Radiculitis and cervicalgia associater with cervical degenerative disc disease.    Standing Status:   Future    Standing Expiration Date:   08/01/2021    Scheduling Instructions:     Procedure: Cervical Epidural Steroid Injection/Block     Level(s): C7-T1     Laterality: TBD     Timeframe: As soon as schedule allows    Order Specific Question:   Where will this procedure be performed?    Answer:   [facility]    Comments:   Kalene Cutler

## 2021-05-04 NOTE — Progress Notes (Addendum)
Patient: Molly Cooke  Service Category: E/M  Provider: Gillis Santa, MD  DOB: 1960/11/14  DOS: 05/04/2021  Location: Office  MRN: [id number]  Setting: Ambulatory outpatient  Referring Provider: Tor Netters, MD  Type: Established Patient  Specialty: Interventional Pain Management  PCP: Tor Netters, MD  Location: Remote location  Delivery: TeleHealth     Virtual Encounter - Pain Management PROVIDER NOTE: Information contained herein reflects review and annotations entered in association with encounter. Interpretation of such information and data should be left to medically-trained personnel. Information provided to patient can be located elsewhere in the medical record under "Patient Instructions". Document created using STT-dictation technology, any transcriptional errors that may result from process are unintentional.    Contact & Pharmacy Preferred: [phone] Home: [phone] (home) Mobile: [phone] (mobile) E-mail: [email or web address]  [place], [place] - [address] [place], [place] [address] Phone: [phone] Fax: [phone]   Pre-screening  Molly Cooke offered "in-person" vs "virtual" encounter. She indicated preferring virtual for this encounter.   Reason COVID-19*   Social distancing based on CDC and AMA recommendations.   I contacted Molly Cooke on 05/04/2021 via telephone.      I clearly identified myself as Gillis Santa, MD. I verified that I was speaking with the correct person using two identifiers (Name: Molly Cooke, and date of birth: [birth year]).  Consent I sought verbal advanced consent from Molly Cooke for virtual visit interactions. I informed Molly Cooke of possible security and privacy concerns, risks, and limitations associated with providing "not-in-person" medical evaluation and management services. I also informed Molly Cooke of the availability of "in-person" appointments. Finally, I informed her  that there would be a charge for the virtual visit and that she could be  personally, fully or partially, financially responsible for it. Molly Cooke expressed understanding and agreed to proceed.   Historic Elements   Molly Cooke is a 61 y.o. year old, female patient evaluated today after our last contact on 04/27/2021. Molly Cooke  has a past medical history of Diabetes mellitus without complication ([place]), Hyperlipidemia, and Hypertension. She also  has a past surgical history that includes Shoulder arthroscopy with bicepstenotomy (Left, 2020); Shoulder arthroscopy with bicepstenotomy (Left, 2021); Hip arthroscopy w/ labral repair (Left, 2019); Ovarian cyst removal (Right, 2000); Knee arthroscopy (Right, 1996); and Tubal ligation (1985). Molly Cooke has a current medication list which includes the following prescription(s): atorvastatin, baclofen, benazepril, diclofenac sodium, duloxetine, glipizide, lorazepam, mesalamine, omeprazole, ozempic (1 mg/dose), gabapentin, mesalamine, metformin, and tramadol. She  reports that she quit smoking about 15 years ago. Her smoking use included cigarettes. She has never used smokeless tobacco. She reports current alcohol use of about 2.0 standard drinks per week. She reports that she does not use drugs. Molly Cooke is allergic to hydrocodone-acetaminophen, nsaids, oxycodone-acetaminophen, clemastine, and hydromorphone.   HPI  Today, she is being contacted for a post-procedure assessment.   Post-procedure evaluation     Procedure:          Anesthesia, Analgesia, Anxiolysis:  Type: Therapeutic Medial Branch Facet Block  #1  Region:Thoracic Level: T9, T10, T11 Medial Branch Level(s) Laterality: Bilateral  Anesthesia: Local (1-2% Lidocaine)  Anxiolysis: None  Sedation: None  Guidance: Fluoroscopy           Position: Prone   1. Thoracic spondylosis without myelopathy   2. Arthropathy of thoracic facet joint   3. Chronic pain syndrome    NAS-11 Pain score:    Pre-procedure: 8 /  10   Post-procedure: 0-No pain/10      Effectiveness:  Initial hour after procedure: 100 %  Subsequent 4-6 hours post-procedure: 100 %  Analgesia past initial 6 hours: 60 % (No relieft and felt pressure for 2 weeks. After 2 weeks nerve pain sunsided and now only feeling 'bone" pain down the spine. However, now that pain is better is back the pain in neck seems to worsen.)  Ongoing improvement:  Analgesic:  90% improvement in neuropathic pain Function: Molly Cooke reports improvement in function ROM: Molly Cooke reports improvement in ROM   Assessment  The primary encounter diagnosis was Cervical radicular pain. Diagnoses of Thoracic spondylosis without myelopathy, Arthropathy of thoracic facet joint, Chronic musculoskeletal pain, Cervicalgia, and Chronic pain syndrome were also pertinent to this visit.  Plan of Care  Problem-specific:  Thoracic spondylosis without myelopathy Status post bilateral T9, T10, T11 thoracic facet medial branch nerve blocks which provided 90% pain relief [place] her thoracic neuropathic pain.  Repeat as needed, consider radiofrequency ablation in future.  Cervicalgia Was evaluated by orthopedics yesterday.  She is having increased cervical spine pain with paresthesias in bilateral upper extremity.  They recommended a cervical epidural injection.  Orders Placed This Encounter  Procedures   Cervical Epidural Injection    Sedation: without Purpose: Diagnostic/Therapeutic Indication(s): Radiculitis and cervicalgia associater with cervical degenerative disc disease.    Standing Status:   Future    Standing Expiration Date:   08/01/2021    Scheduling Instructions:     Procedure: Cervical Epidural Steroid Injection/Block     Level(s): C7-T1     Laterality: TBD     Timeframe: As soon as schedule allows    Order Specific Question:   Where will this procedure be performed?    Answer:   [facility]    Comments:   [facility]     Cervical  radicular pain Orders Placed This Encounter  Procedures   Cervical Epidural Injection    Sedation: without Purpose: Diagnostic/Therapeutic Indication(s): Radiculitis and cervicalgia associater with cervical degenerative disc disease.    Standing Status:   Future    Standing Expiration Date:   08/01/2021    Scheduling Instructions:     Procedure: Cervical Epidural Steroid Injection/Block     Level(s): C7-T1     Laterality: TBD     Timeframe: As soon as schedule allows    Order Specific Question:   Where will this procedure be performed?    Answer:   [facility]    Comments:   [facility]     Molly Cooke has a current medication list which includes the following long-term medication(s): atorvastatin, benazepril, duloxetine, glipizide, mesalamine, omeprazole, gabapentin, mesalamine, and metformin.   Follow-up plan:   Return in 2 weeks (on 05/18/2021) for C-ESI , in clinic [facility].    Recent Visits Date Type Provider Dept  03/30/21 Procedure visit Gillis Santa, MD [facility]  03/15/21 Office Visit Gillis Santa, MD [facility]  Showing recent visits within past 90 days and meeting all other requirements Today's Visits Date Type Provider Dept  05/04/21 Office Visit Gillis Santa, MD [facility]  Showing today's visits and meeting all other requirements Future Appointments No visits were found meeting these conditions. Showing future appointments within next 90 days and meeting all other requirements  I discussed the assessment and treatment plan with the patient. The patient was provided an opportunity to ask questions and all were answered. The patient agreed with the plan and  demonstrated an understanding of the instructions.  Patient advised to call back or seek an in-person evaluation if the symptoms or condition worsens.  Duration of encounter: 55minutes.  Note by: Gillis Santa, MD Date: 05/04/2021; Time: 11:47 AM

## 2021-05-04 NOTE — Assessment & Plan Note (Deleted)
Was evaluated by orthopedics yesterday.  She is having increased cervical spine pain with paresthesias in bilateral upper extremity.  They recommended a cervical spinal injection.  This sounds like a cervical epidural injection that they are referring to however patient will contact clinic to have them fax [place] doctors note from yesterday.  After review, I will place an order.

## 2021-05-16 ENCOUNTER — Ambulatory Visit (HOSPITAL_BASED_OUTPATIENT_CLINIC_OR_DEPARTMENT_OTHER): Payer: 59 | Admitting: Student in an Organized Health Care Education/Training Program

## 2021-05-16 ENCOUNTER — Encounter: Payer: Self-pay | Admitting: Student in an Organized Health Care Education/Training Program

## 2021-05-16 ENCOUNTER — Ambulatory Visit
Admission: RE | Admit: 2021-05-16 | Discharge: 2021-05-16 | Disposition: A | Discharge status: Home / Self Care | Payer: 59 | Source: Ambulatory Visit | Attending: Student in an Organized Health Care Education/Training Program | Admitting: Student in an Organized Health Care Education/Training Program

## 2021-05-16 ENCOUNTER — Other Ambulatory Visit: Payer: Self-pay

## 2021-05-16 DIAGNOSIS — M5412 Radiculopathy, cervical region: Secondary | ICD-10-CM | POA: Diagnosis not present

## 2021-05-16 DIAGNOSIS — M542 Cervicalgia: Secondary | ICD-10-CM | POA: Diagnosis present

## 2021-05-16 DIAGNOSIS — G894 Chronic pain syndrome: Secondary | ICD-10-CM

## 2021-05-16 MED ORDER — ROPIVACAINE HCL 2 MG/ML IJ SOLN
1.0000 mL | Freq: Once | INTRAMUSCULAR | Status: AC
  Administered 2021-05-16: 1 mL via EPIDURAL

## 2021-05-16 MED ORDER — IOHEXOL 180 MG/ML  SOLN
10.0000 mL | Freq: Once | INTRAMUSCULAR | Status: AC
  Administered 2021-05-16: 5 mL via EPIDURAL

## 2021-05-16 MED ORDER — ROPIVACAINE HCL 2 MG/ML IJ SOLN
INTRAMUSCULAR | Status: AC
  Filled 2021-05-16: qty 20

## 2021-05-16 MED ORDER — SODIUM CHLORIDE (PF) 0.9 % IJ SOLN
INTRAMUSCULAR | Status: AC
  Filled 2021-05-16: qty 10

## 2021-05-16 MED ORDER — DEXAMETHASONE SODIUM PHOSPHATE 10 MG/ML IJ SOLN
10.0000 mg | Freq: Once | INTRAMUSCULAR | Status: AC
  Administered 2021-05-16: 10 mg
  Filled 2021-05-16: qty 1

## 2021-05-16 MED ORDER — LIDOCAINE HCL 2 % IJ SOLN
20.0000 mL | Freq: Once | INTRAMUSCULAR | Status: AC
  Administered 2021-05-16: 400 mg
  Filled 2021-05-16: qty 20

## 2021-05-16 MED ORDER — LIDOCAINE HCL 2 % IJ SOLN
INTRAMUSCULAR | Status: AC
  Filled 2021-05-16: qty 20

## 2021-05-16 MED ORDER — SODIUM CHLORIDE 0.9% FLUSH
1.0000 mL | Freq: Once | INTRAVENOUS | Status: AC
  Administered 2021-05-16: 1 mL

## 2021-05-16 NOTE — Progress Notes (Signed)
PROVIDER NOTE: Interpretation of information contained herein should be left to medically-trained personnel. Specific patient instructions are provided elsewhere under "Patient Instructions" section of medical record. This document was created in part using STT-dictation technology, any transcriptional errors that may result from this process are unintentional.  Patient: Molly BiggerMichele Cooke Type: Established DOB: 1960-10-30 MRN: [id number] PCP: Babs BertinNasrat, Safwat, MD  Service: Procedure DOS: 05/16/2021 Setting: Ambulatory Location: Ambulatory outpatient facility Delivery: Face-to-face Provider: Edward JollyBilal Taahir Grisby, MD Specialty: Interventional Pain Management Specialty designation: 09 Location: Outpatient facility Ref. Prov.: Edward JollyLateef, Mang Hazelrigg, MD   Procedure [facility] Interventional Pain Management )   Type: Cervical Epidural Steroid injection (ESI) (Interlaminar) #1  Laterality: Midline  Level: C7-T1 Imaging: Fluoroscopy-assisted DOS: 05/16/2021  Performed by: Edward JollyBilal Teshawn Moan, MD Anesthesia: Local anesthesia (1-2% Lidocaine) Anxiolysis: None                 Sedation: None.   Purpose: Diagnostic/Therapeutic Indications: Cervicalgia, cervical radicular pain, degenerative disc disease, severe enough to impact quality of life or function. 1. Chronic pain syndrome   2. Cervicalgia   3. Cervical radicular pain    NAS-11 score:   Pre-procedure: 8 /10   Post-procedure: 0-No pain/10     Pre-Procedure Preparation  Monitoring: As per clinic protocol. Respiration, ETCO2, SpO2, BP, heart rate and rhythm monitor placed and checked for adequate function  Risk Assessment: Vitals:  HYQ:MVHQIONGEBMI:Estimated body mass index is 26.57 kg/m as calculated from the following:   Height as of this encounter: 5\' 3"  (1.6 m).   Weight as of this encounter: 150 lb (68 kg)., Rate:79ECG Heart Rate: 73, BP:124/77, Resp:16, Temp:(!) 97.2 F (36.2 C), SpO2:100 %  Allergies: She is allergic to hydrocodone-acetaminophen, nsaids,  oxycodone-acetaminophen, clemastine, and hydromorphone.  Precautions: None required  Blood-thinner(s): None at this time  Coagulopathies: Reviewed. None identified.   Active Infection(s): Reviewed. None identified. Molly Cooke is afebrile   Location setting: Procedure suite Position: Prone, on modified reverse trendelenburg to facilitate breathing, with head in head-cradle. Pillows positioned under chest (below chin-level) with cervical spine flexed. Safety Precautions: Patient was assessed for positional comfort and pressure points before starting the procedure. Prepping solution: DuraPrep (Iodine Povacrylex [0.7% available iodine] and Isopropyl Alcohol, 74% w/w) Prep Area: Entire  cervicothoracic region Approach: percutaneous, paramedial Intended target: Posterior cervical epidural space Materials: Tray: Epidural Needle(s): Epidural (Tuohy) Qty: 1 Length: (90mm) 3.5-inch Gauge: 22G  3 cc solution made of 1 cc of preservative-free saline, 1 cc of 0.2% ropivacaine, 1 cc of Decadron 10 mg/cc.    Meds ordered this encounter  Medications   iohexol (OMNIPAQUE) 180 MG/ML injection 10 mL    Must be Myelogram-compatible. If not available, you may substitute with a water-soluble, non-ionic, hypoallergenic, myelogram-compatible radiological contrast medium.   lidocaine (XYLOCAINE) 2 % (with pres) injection 400 mg   ropivacaine (PF) 2 mg/mL (0.2%) (NAROPIN) injection 1 mL   sodium chloride flush (NS) 0.9 % injection 1 mL   dexamethasone (DECADRON) injection 10 mg    Orders Placed This Encounter  Procedures   DG PAIN CLINIC C-ARM 1-60 MIN NO REPORT    Intraoperative interpretation by procedural physician at [facility].    Standing Status:   Standing    Number of Occurrences:   1    Order Specific Question:   Reason for exam:    Answer:   Assistance in needle guidance and placement for procedures requiring needle placement in or near specific anatomical locations not easily  accessible without such assistance.     Time-out: 1122  I initiated and conducted the "Time-out" before starting the procedure, as per protocol. The patient was asked to participate by confirming the accuracy of the "Time Out" information. Verification of the correct person, site, and procedure were performed and confirmed by me, the nursing staff, and the patient. "Time-out" conducted as per Joint Commission's Universal Protocol (UP.01.01.01). Procedure checklist: Completed   H&P (Pre-op  Assessment)  Molly Cooke is a 61 y.o. (year old), female patient, seen today for interventional treatment. She  has a past surgical history that includes Shoulder arthroscopy with bicepstenotomy (Left, 2020); Shoulder arthroscopy with bicepstenotomy (Left, 2021); Hip arthroscopy w/ labral repair (Left, 2019); Ovarian cyst removal (Right, 2000); Knee arthroscopy (Right, 1996); and Tubal ligation (1985). Molly Cooke has a current medication list which includes the following prescription(s): atorvastatin, benazepril, diclofenac sodium, glipizide, lorazepam, mesalamine, omeprazole, ozempic (1 mg/dose), baclofen, duloxetine, gabapentin, mesalamine, metformin, and tramadol. Her primarily concern today is the Neck Pain (Bilateral )  She is allergic to hydrocodone-acetaminophen, nsaids, oxycodone-acetaminophen, clemastine, and hydromorphone.   Last encounter: My last encounter with her was on 05/04/2021. Pertinent problems: Molly Cooke does not have any pertinent problems on file. Pain Assessment: Severity of Chronic pain is reported as a 8 /10. Location: Neck Left, Right/into left shoulder and when she raises her arms tingling down both arms.  sometimes lying on her sides will cause it. shooting pains into the base of her head and changes laterality. Onset: More than a month ago. Quality: Discomfort, Constant, Tingling, Shooting. Timing: Constant. Modifying factor(s): laying down.. Vitals:  height is 5\' 3"  (1.6 m) and weight is 150  lb (68 kg). Her temporal temperature is 97.2 F (36.2 C) (abnormal). Her blood pressure is 117/74 and her pulse is 79. Her respiration is 18 and oxygen saturation is 95%.   Reason for encounter: Interventional pain management therapy due pain of at least four (4) weeks in duration, with to failure to respond to and/or inability to tolerate more conservative care.   Site Confirmation: Molly Cooke was asked to confirm the procedure and laterality before marking the site.  Consent: Before the procedure and under the influence of no sedative(s), amnesic(s), or anxiolytics, the patient was informed of the treatment options, risks and possible complications. To fulfill our ethical and legal obligations, as recommended by the American Medical Association's Code of Ethics, I have informed the patient of my clinical impression; the nature and purpose of the treatment or procedure; the risks, benefits, and possible complications of the intervention; the alternatives, including doing nothing; the risk(s) and benefit(s) of the alternative treatment(s) or procedure(s); and the risk(s) and benefit(s) of doing nothing. The patient was provided information about the general risks and possible complications associated with the procedure. These may include, but are not limited to: failure to achieve desired goals, infection, bleeding, organ or nerve damage, allergic reactions, paralysis, and death. In addition, the patient was informed of those risks and complications associated to Spine-related procedures, such as failure to decrease pain; infection (i.e.: Meningitis, epidural or intraspinal abscess); bleeding (i.e.: epidural hematoma, subarachnoid hemorrhage, or any other type of intraspinal or peri-dural bleeding); organ or nerve damage (i.e.: Any type of peripheral nerve, nerve root, or spinal cord injury) with subsequent damage to sensory, motor, and/or autonomic systems, resulting in permanent pain, numbness, and/or  weakness of one or several areas of the body; allergic reactions; (i.e.: anaphylactic reaction); and/or death. Furthermore, the patient was informed of those risks and complications associated with the medications. These include, but are  not limited to: allergic reactions (i.e.: anaphylactic or anaphylactoid reaction(s)); adrenal axis suppression; blood sugar elevation that in diabetics may result in ketoacidosis or comma; water retention that in patients with history of congestive heart failure may result in shortness of breath, pulmonary edema, and decompensation with resultant heart failure; weight gain; swelling or edema; medication-induced neural toxicity; particulate matter embolism and blood vessel occlusion with resultant organ, and/or nervous system infarction; and/or aseptic necrosis of one or more joints. Finally, the patient was informed that Medicine is not an exact science; therefore, there is also the possibility of unforeseen or unpredictable risks and/or possible complications that may result in a catastrophic outcome. The patient indicated having understood very clearly. We have given the patient no guarantees and we have made no promises. Enough time was given to the patient to ask questions, all of which were answered to the patient's satisfaction. [name] has indicated that she wanted to continue with the procedure. Attestation: I, the ordering provider, attest that I have discussed with the patient the benefits, risks, side-effects, alternatives, likelihood of achieving goals, and potential problems during recovery for the procedure that I have provided informed consent.  Date   Time: 05/16/2021 10:52 AM    Description of procedure   Start Time: 1122 hrs  Local Anesthesia: Once the patient was positioned, prepped, and time-out was completed. The target area was identified located. The skin was marked with an approved surgical skin marker. Once marked, the skin (epidermis, dermis,  and hypodermis), and deeper tissues (fat, connective tissue and muscle) were infiltrated with a small amount of a short-acting local anesthetic, loaded on a 10cc syringe with a 25G, 1.5-in  Needle. An appropriate amount of time was allowed for local anesthetics to take effect before proceeding to the next step. Local Anesthetic: Lidocaine 1-2% The unused portion of the local anesthetic was discarded in the proper designated containers. Safety Precautions: Aspiration looking for blood return was conducted prior to all injections. At no point did I inject any substances, as a needle was being advanced. Before injecting, the patient was told to immediately notify me if she was experiencing any new onset of "ringing in the ears, or metallic taste in the mouth". No attempts were made at seeking any paresthesias. Safe injection practices and needle disposal techniques used. Medications properly checked for expiration dates. SDV (single dose vial) medications used. After the completion of the procedure, all disposable equipment used was discarded in the proper designated medical waste containers.  Technical description: Protocol guidelines were followed. Using fluoroscopic guidance, the epidural needle was introduced through the skin, ipsilateral to the reported pain, and advanced to the target area. Posterior laminar os was contacted and the needle walked caudad, until the lamina was cleared. The ligamentum flavum was engaged and the epidural space identified using loss-of-resistance technique with 2-3 ml of PF-NaCl (0.9% NSS), in a 5cc dedicated LOR syringe. See "Imaging guidance" below for use of contrast details.  Injection: Once satisfactory needle placement was confirmed, I proceeded to inject the desired solution in slow, incremental fashion, intermittently assessing for discomfort or any signs of abnormal or undesired spread of substance. Once completed, the needle was removed and disposed of, as per  hospital protocols.   Vitals:   05/16/21 1120 05/16/21 1123 05/16/21 1126 05/16/21 1129  BP: 111/76 112/65 108/63 117/74  Pulse:      Resp: 20 20 19 18   Temp:      TempSrc:      SpO2: 97% 96%  95% 95%  Weight:      Height:        End Time: 1127 hrs  Once the entire procedure was completed, the treated area was cleaned, making sure to leave some of the prepping solution back to take advantage of its long term bactericidal properties.   Imaging guidance  Type of Imaging Technique: Fluoroscopy Guidance (Spinal) Indication(s): Assistance in needle guidance and placement for procedures requiring needle placement in or near specific anatomical locations not easily accessible without such assistance. Exposure Time: Please see nurses notes for exact fluoroscopy time. Contrast: Before injecting any contrast, we confirmed that the patient did not have an allergy to iodine, shellfish, or radiological contrast. Once satisfactory needle placement was completed, radiological contrast was injected under continuous fluoroscopic guidance. Injection of contrast accomplished without complications. See chart for type and volume of contrast used. Fluoroscopic Guidance: I was personally present in the fluoroscopy suite, where the patient was placed in position for the procedure, over the fluoroscopy-compatible table. Fluoroscopy was manipulated, using "Tunnel Vision Technique", to obtain the best possible view of the target area, on the affected side. Parallax error was corrected before commencing the procedure. A "direction-depth-direction" technique was used to introduce the needle under continuous pulsed fluoroscopic guidance. Once the target was reached, antero-posterior, oblique, and lateral fluoroscopic projection views were taken to confirm needle placement in all planes. Electronic images uploaded into EMR.  Interpretation: Successful epidural injection. Intraoperative imaging interpretation by performing  Physician.    Post-op assessment  Post-procedure Vital Signs:  Pulse/HCG Rate: 7963 Temp: (!) 97.2 F (36.2 C) Resp: 18 BP: 117/74 SpO2: 95 %  EBL: None  Complications: No immediate post-treatment complications observed by team, or reported by patient.  Note: The patient tolerated the entire procedure well. A repeat set of vitals were taken after the procedure and the patient was kept under observation following institutional policy, for this type of procedure. Post-procedural neurological assessment was performed, showing return to baseline, prior to discharge. The patient was provided with post-procedure discharge instructions, including a section on how to identify potential problems. Should any problems arise concerning this procedure, the patient was given instructions to immediately contact us, at any time, without hesitation. In any case, we plan to contact the patient by telephone for a follow-up status report regarding this interventional procedure.  Comments:  No additional relevant information.   5 out of 5 strength bilateral upper extremity: Shoulder abduction, elbow flexion, elbow extension, thumb extension.   Plan of care   Medications administered: We administered iohexol, lidocaine, ropivacaine (PF) 2 mg/mL (0.2%), sodium chloride flush, and dexamethasone.  Follow-up plan:   Return in about 4 weeks (around 06/13/2021) for  PPE, VV.     Recent Visits Date Type Provider Dept  05/04/21 Office Visit Edward Jolly, MD [facility]  03/30/21 Procedure visit Edward Jolly, MD [facility]  03/15/21 Office Visit Edward Jolly, MD [facility]  Showing recent visits within past 90 days and meeting all other requirements Today's Visits Date Type Provider Dept  05/16/21 Procedure visit Edward Jolly, MD [facility]  Showing today's visits and meeting all other requirements Future Appointments Date Type Provider Dept  06/14/21  Appointment Edward Jolly, MD [facility]  Showing future appointments within next 90 days and meeting all other requirements   Disposition: Discharge home  Discharge (Date   Time): 05/16/2021; 1136 hrs.   Primary Care Physician: Babs Bertin, MD Location: [facility] Note by:  Edward Jolly, MD Date: 05/16/2021; Time: 11:41 AM  DISCLAIMER: Medicine is not an Visual merchandiser. It has no guarantees or warranties. The decision to proceed with this intervention was based on the information collected from the patient. Conclusions were drawn from the patient's questionnaire, interview, and examination. Because information was provided in large part by the patient, it cannot be guaranteed that it has not been purposely or unconsciously manipulated or altered. Every effort has been made to obtain as much accurate, relevant, available data as possible. Always take into account that the treatment will also be dependent on availability of resources and existing treatment guidelines, considered by other Pain Management Specialists as being common knowledge and practice, at the time of the intervention. It is also important to point out that variation in procedural techniques and pharmacological choices are the acceptable norm. For Medico-Legal review purposes, the indications, contraindications, technique, and results of the these procedures should only be evaluated, judged and interpreted by a Board-Certified Interventional Pain Specialist with extensive familiarity and expertise in the same exact procedure and technique.

## 2021-05-16 NOTE — Progress Notes (Signed)
Safety precautions to be maintained throughout the outpatient stay will include: orient to surroundings, keep bed in low position, maintain call bell within reach at all times, provide assistance with transfer out of bed and ambulation.  

## 2021-05-16 NOTE — Patient Instructions (Signed)

## 2021-05-17 ENCOUNTER — Telehealth: Payer: Self-pay

## 2021-05-17 NOTE — Telephone Encounter (Signed)
Post procedure phone call.  Patient states she was up during the night and had been throwing up.  Informed patient to let us know if symptoms got worse or did not resolve or if she has any other questions or concerns.

## 2021-06-13 ENCOUNTER — Encounter: Payer: Self-pay | Admitting: Student in an Organized Health Care Education/Training Program

## 2021-06-13 ENCOUNTER — Encounter: Payer: Self-pay | Admitting: Certified Nurse Midwife

## 2021-06-13 ENCOUNTER — Other Ambulatory Visit (HOSPITAL_COMMUNITY)
Admission: RE | Admit: 2021-06-13 | Discharge: 2021-06-13 | Disposition: A | Discharge status: Home / Self Care | Payer: 59 | Source: Ambulatory Visit | Attending: Certified Nurse Midwife | Admitting: Certified Nurse Midwife

## 2021-06-13 ENCOUNTER — Other Ambulatory Visit: Payer: Self-pay

## 2021-06-13 ENCOUNTER — Ambulatory Visit (INDEPENDENT_AMBULATORY_CARE_PROVIDER_SITE_OTHER): Payer: 59 | Admitting: Certified Nurse Midwife

## 2021-06-13 VITALS — BP 153/83 | HR 79 | Ht 64.0 in | Wt 154.5 lb

## 2021-06-13 DIAGNOSIS — L989 Disorder of the skin and subcutaneous tissue, unspecified: Secondary | ICD-10-CM

## 2021-06-13 DIAGNOSIS — N949 Unspecified condition associated with female genital organs and menstrual cycle: Secondary | ICD-10-CM
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
## 2021-06-14 ENCOUNTER — Other Ambulatory Visit: Payer: Self-pay | Admitting: Certified Nurse Midwife

## 2021-06-14 ENCOUNTER — Encounter: Payer: Self-pay | Admitting: Student in an Organized Health Care Education/Training Program

## 2021-06-14 ENCOUNTER — Encounter: Payer: Self-pay | Admitting: Certified Nurse Midwife

## 2021-06-14 ENCOUNTER — Ambulatory Visit
Payer: 59 | Attending: Student in an Organized Health Care Education/Training Program | Admitting: Student in an Organized Health Care Education/Training Program

## 2021-06-14 DIAGNOSIS — G894 Chronic pain syndrome: Secondary | ICD-10-CM | POA: Diagnosis not present

## 2021-06-14 DIAGNOSIS — M5412 Radiculopathy, cervical region: Secondary | ICD-10-CM | POA: Diagnosis not present

## 2021-06-14 DIAGNOSIS — M47814 Spondylosis without myelopathy or radiculopathy, thoracic region: Secondary | ICD-10-CM | POA: Diagnosis not present

## 2021-06-14 DIAGNOSIS — M542 Cervicalgia: Secondary | ICD-10-CM

## 2021-06-14 LAB — SURGICAL PATHOLOGY

## 2021-06-14 LAB — CERVICOVAGINAL ANCILLARY ONLY
Bacterial Vaginitis (gardnerella): NEGATIVE
Candida Glabrata: NEGATIVE
Candida Vaginitis: NEGATIVE
Comment: NEGATIVE
Comment: NEGATIVE
Comment: NEGATIVE

## 2021-06-14 MED ORDER — CLOBETASOL PROPIONATE 0.05 % EX CREA: 1.0000 "application " | TOPICAL_CREAM | Freq: Two times a day (BID) | CUTANEOUS | 0 refills | Status: DC | PRN

## 2021-06-14 NOTE — Progress Notes (Signed)
Patient: Molly Cooke  Service Category: E/M  Provider: Gillis Santa, MD  ?DOB: 1960/05/21  DOS: 06/14/2021  Location: Office  ?MRN: [id number]  Setting: Ambulatory outpatient  Referring Provider: Tor Netters, MD  ?Type: Established Patient  Specialty: Interventional Pain Management  PCP: Tor Netters, MD  ?Location: Home  Delivery: TeleHealth    ? ?Virtual Encounter - Pain Management ?PROVIDER NOTE: Information contained herein reflects review and annotations entered in association with encounter. Interpretation of such information and data should be left to medically-trained personnel. Information provided to patient can be located elsewhere in the medical record under "Patient Instructions". Document created using STT-dictation technology, any transcriptional errors that may result from process are unintentional.  ?  ?Contact & Pharmacy ?Preferred: [phone] ?Home: [phone] (home) ?Mobile: [phone] (mobile) ?E-mail: [email or web address]  ?[place], [place], [address] ?[place], [address] ?[place] [address] ?Phone: [phone] Fax: [phone] ?  ?Pre-screening  ?[name] offered "in-person" vs "virtual" encounter. She indicated preferring virtual for this encounter.  ? ?Reason ?COVID-19*  Social distancing based on CDC and AMA recommendations.  ? ?I contacted [name] on 06/14/2021 via telephone.      I clearly identified myself as Gillis Santa, MD. I verified that I was speaking with the correct person using two identifiers (Name: Molly Cooke, and date of birth: Jul 28, 1960). ? ?Consent ?I sought verbal advanced consent from [name] for virtual visit interactions. I informed [name] of possible security and privacy concerns, risks, and limitations associated with providing "not-in-person" medical evaluation and management services. I also informed [name] of the availability of "in-person" appointments. Finally, I informed her that there  would be a charge for the virtual visit and that she could be  personally, fully or partially, financially responsible for it. [name] expressed understanding and agreed to proceed.  ? ?Historic Elements   ?Ms. Molly Cooke is a 61 y.o. year old, female patient evaluated today after our last contact on 05/16/2021. [name]  has a past medical history of Diabetes mellitus without complication ([place]), Hyperlipidemia, and Hypertension. She also  has a past surgical history that includes Shoulder arthroscopy with bicepstenotomy (Left, 2020); Shoulder arthroscopy with bicepstenotomy (Left, 2021); Hip arthroscopy w/ labral repair (Left, 2019); Ovarian cyst removal (Right, 2000); Knee arthroscopy (Right, 1996); and Tubal ligation (1985). [name] has a current medication list which includes the following prescription(s): atorvastatin, benazepril, diclofenac sodium, glipizide, lorazepam, mesalamine, omeprazole, and ozempic (1 mg/dose). She  reports that she quit smoking about 15 years ago. Her smoking use included cigarettes. She has never used smokeless tobacco. She reports current alcohol use of about 2.0 standard drinks per week. She reports that she does not use drugs. [name] is allergic to hydrocodone-acetaminophen, nsaids, oxycodone-acetaminophen, clemastine, and hydromorphone.  ? ?HPI  ?Today, she is being contacted for a post-procedure assessment. ? ? ?Post-procedure evaluation  ?  Type: Cervical Epidural Steroid injection (ESI) (Interlaminar) #1  ?Laterality: Midline  ?Level: C7-T1 ?Imaging: Fluoroscopy-assisted ?DOS: 05/16/2021  ?Performed by: Gillis Santa, MD ?Anesthesia: Local anesthesia (1-2% Lidocaine) ?Anxiolysis: None                 ?Sedation: None.  ? ?Purpose: Diagnostic/Therapeutic ?Indications: Cervicalgia, cervical radicular pain, degenerative disc disease, severe enough to impact quality of life or function. ?1. Chronic pain syndrome   ?2. Cervicalgia   ?3. Cervical radicular pain   ? ?NAS-11  score:  ? Pre-procedure: 8 /10  ? Post-procedure: 0-No pain/10  ?   ?  Effectiveness:  ?Initial hour after procedure: 100 %  ?Subsequent 4-6 hours post-procedure: 50 %  ?Analgesia past initial 6 hours: 90 %  ?Ongoing improvement:  ?Analgesic:  90% ?Function: Back to baseline, states that she continues to have weakness in her hands  ?ROM: Somewhat improved ? ? ?Assessment  ?The primary encounter diagnosis was Chronic pain syndrome. Diagnoses of Cervicalgia, Cervical radicular pain, Thoracic spondylosis without myelopathy, and Arthropathy of thoracic facet joint were also pertinent to this visit. ? ?Plan of Care  ?Good benefit with cervical epidural steroid injection as detailed above.  However patient continues to have weakness in her hands and has an upcoming appointment with neurosurgery next month. ?Follow-up as needed for repeat C-ESI or thoracic facet  MBNB/RFA ? ? ?Return if symptoms worsen or fail to improve.   ? ?Recent Visits ?Date Type Provider Dept  ?05/16/21 Procedure visit Gillis Santa, MD [facility]  ?05/04/21 Office Visit Gillis Santa, MD [facility]  ?03/30/21 Procedure visit Gillis Santa, MD [facility]  ?Showing recent visits within past 90 days and meeting all other requirements ?Today's Visits ?Date Type Provider Dept  ?06/14/21 Office Visit Gillis Santa, MD [facility]  ?Showing today's visits and meeting all other requirements ?Future Appointments ?No visits were found meeting these conditions. ?Showing future appointments within next 90 days and meeting all other requirements ? ?I discussed the assessment and treatment plan with the patient. The patient was provided an opportunity to ask questions and all were answered. The patient agreed with the plan and demonstrated an understanding of the instructions. ? ?Patient advised to call back or seek an in-person evaluation if the symptoms or condition worsens. ? ?Duration of encounter: 57minutes. ? ?Note  by: Gillis Santa, MD ?Date: 06/14/2021; Time: 10:39 AM ?

## 2021-06-30 ENCOUNTER — Other Ambulatory Visit (HOSPITAL_COMMUNITY): Payer: Self-pay | Admitting: Neurosurgery

## 2021-06-30 ENCOUNTER — Other Ambulatory Visit: Payer: Self-pay | Admitting: Neurosurgery

## 2021-06-30 DIAGNOSIS — M5412 Radiculopathy, cervical region: Secondary | ICD-10-CM

## 2021-06-30 DIAGNOSIS — M542 Cervicalgia: Secondary | ICD-10-CM

## 2021-07-04 ENCOUNTER — Ambulatory Visit (INDEPENDENT_AMBULATORY_CARE_PROVIDER_SITE_OTHER): Payer: 59 | Admitting: Certified Nurse Midwife

## 2021-07-04 ENCOUNTER — Other Ambulatory Visit (HOSPITAL_COMMUNITY)
Admission: RE | Admit: 2021-07-04 | Discharge: 2021-07-04 | Disposition: A | Discharge status: Home / Self Care | Payer: 59 | Source: Ambulatory Visit | Attending: Certified Nurse Midwife | Admitting: Certified Nurse Midwife

## 2021-07-04 ENCOUNTER — Encounter: Payer: Self-pay | Admitting: Certified Nurse Midwife

## 2021-07-04 VITALS — BP 153/93 | HR 76 | Ht 64.0 in | Wt 155.3 lb

## 2021-07-04 DIAGNOSIS — Z01419 Encounter for gynecological examination (general) (routine) without abnormal findings: Secondary | ICD-10-CM | POA: Diagnosis present

## 2021-07-04 DIAGNOSIS — Z124 Encounter for screening for malignant neoplasm of cervix: Secondary | ICD-10-CM

## 2021-07-04 DIAGNOSIS — Z1231 Encounter for screening mammogram for malignant neoplasm of breast: Secondary | ICD-10-CM | POA: Diagnosis not present

## 2021-07-04 DIAGNOSIS — R399 Unspecified symptoms and signs involving the genitourinary system: Secondary | ICD-10-CM

## 2021-07-04 LAB — POCT URINALYSIS DIPSTICK OB
Bilirubin, UA: NEGATIVE
Blood, UA: NEGATIVE
Glucose, UA: NEGATIVE
Ketones, UA: NEGATIVE
Leukocytes, UA: NEGATIVE
Nitrite, UA: NEGATIVE
Spec Grav, UA: >=1.03 — AB (ref 1.010–1.025)
Urobilinogen, UA: 0.2 E.U./dL
pH, UA: 6 (ref 5.0–8.0)
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
## 2021-07-04 NOTE — Patient Instructions (Signed)

## 2021-07-05 LAB — CYTOLOGY - PAP
Adequacy: ABSENT
Comment: NEGATIVE
Diagnosis: NEGATIVE
High risk HPV: NEGATIVE

## 2021-07-06 LAB — URINE CULTURE

## 2021-07-20 ENCOUNTER — Other Ambulatory Visit: Payer: 59

## 2021-08-03 ENCOUNTER — Ambulatory Visit
Admission: RE | Admit: 2021-08-03 | Discharge: 2021-08-03 | Disposition: A | Discharge status: Home / Self Care | Payer: 59 | Source: Ambulatory Visit | Attending: Neurosurgery | Admitting: Neurosurgery

## 2021-08-03 DIAGNOSIS — M5412 Radiculopathy, cervical region: Secondary | ICD-10-CM | POA: Diagnosis present

## 2021-08-03 DIAGNOSIS — M542 Cervicalgia: Secondary | ICD-10-CM | POA: Diagnosis present

## 2021-08-17 ENCOUNTER — Other Ambulatory Visit: Payer: Self-pay | Admitting: Neurosurgery

## 2021-08-17 DIAGNOSIS — M5489 Other dorsalgia: Secondary | ICD-10-CM

## 2021-08-31 ENCOUNTER — Ambulatory Visit
Admission: RE | Admit: 2021-08-31 | Discharge: 2021-08-31 | Disposition: A | Discharge status: Home / Self Care | Payer: 59 | Source: Ambulatory Visit | Attending: Neurosurgery | Admitting: Neurosurgery

## 2021-08-31 DIAGNOSIS — M5489 Other dorsalgia: Secondary | ICD-10-CM | POA: Insufficient documentation

## 2021-09-01 ENCOUNTER — Ambulatory Visit
Payer: 59 | Attending: Student in an Organized Health Care Education/Training Program | Admitting: Student in an Organized Health Care Education/Training Program

## 2021-09-01 ENCOUNTER — Encounter: Payer: Self-pay | Admitting: Student in an Organized Health Care Education/Training Program

## 2021-09-01 VITALS — BP 144/84 | HR 66 | Temp 97.3°F | Ht 64.0 in | Wt 156.0 lb

## 2021-09-01 DIAGNOSIS — M47816 Spondylosis without myelopathy or radiculopathy, lumbar region: Secondary | ICD-10-CM | POA: Diagnosis present

## 2021-09-01 DIAGNOSIS — G894 Chronic pain syndrome: Secondary | ICD-10-CM | POA: Diagnosis not present
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
## 2021-09-01 NOTE — Progress Notes (Signed)
Safety precautions to be maintained throughout the outpatient stay will include: orient to surroundings, keep bed in low position, maintain call bell within reach at all times, provide assistance with transfer out of bed and ambulation.  

## 2021-09-01 NOTE — Patient Instructions (Signed)
Please get records from [facility] specifically [facility] and give to me

## 2021-09-08 DIAGNOSIS — E119 Type 2 diabetes mellitus without complications: Secondary | ICD-10-CM | POA: Insufficient documentation

## 2021-09-08 DIAGNOSIS — I10 Essential (primary) hypertension: Secondary | ICD-10-CM | POA: Insufficient documentation

## 2021-09-24 DIAGNOSIS — S2249XA Multiple fractures of ribs, unspecified side, initial encounter for closed fracture: Secondary | ICD-10-CM

## 2021-09-24 HISTORY — DX: Multiple fractures of ribs, unspecified side, initial encounter for closed fracture: S22.49XA

## 2021-10-05 ENCOUNTER — Ambulatory Visit
Payer: 59 | Attending: Student in an Organized Health Care Education/Training Program | Admitting: Student in an Organized Health Care Education/Training Program

## 2021-10-11 DIAGNOSIS — S2243XA Multiple fractures of ribs, bilateral, initial encounter for closed fracture: Secondary | ICD-10-CM | POA: Insufficient documentation

## 2021-10-26 ENCOUNTER — Encounter: Payer: Self-pay | Admitting: Student in an Organized Health Care Education/Training Program

## 2021-10-26 ENCOUNTER — Ambulatory Visit
Admission: RE | Admit: 2021-10-26 | Discharge: 2021-10-26 | Disposition: A | Discharge status: Home / Self Care | Payer: 59 | Source: Ambulatory Visit | Attending: Student in an Organized Health Care Education/Training Program | Admitting: Student in an Organized Health Care Education/Training Program

## 2021-10-26 ENCOUNTER — Ambulatory Visit
Payer: 59 | Attending: Student in an Organized Health Care Education/Training Program | Admitting: Student in an Organized Health Care Education/Training Program

## 2021-10-26 VITALS — BP 145/104 | HR 88 | Temp 97.3°F | Resp 20 | Ht 64.0 in | Wt 150.0 lb

## 2021-10-26 DIAGNOSIS — G894 Chronic pain syndrome: Secondary | ICD-10-CM | POA: Insufficient documentation

## 2021-10-26 DIAGNOSIS — M47816 Spondylosis without myelopathy or radiculopathy, lumbar region: Secondary | ICD-10-CM | POA: Diagnosis not present

## 2021-10-26 MED ORDER — DEXAMETHASONE SODIUM PHOSPHATE 10 MG/ML IJ SOLN
10.0000 mg | Freq: Once | INTRAMUSCULAR | Status: AC
  Administered 2021-10-26: 10 mg
  Filled 2021-10-26: qty 1

## 2021-10-26 MED ORDER — ROPIVACAINE HCL 2 MG/ML IJ SOLN
9.0000 mL | Freq: Once | INTRAMUSCULAR | Status: AC
Start: 2021-10-26 — End: 2021-10-26
  Administered 2021-10-26: 9 mL via PERINEURAL
  Filled 2021-10-26: qty 20

## 2021-10-26 MED ORDER — IOHEXOL 180 MG/ML  SOLN: 10.0000 mL | Freq: Once | INTRAMUSCULAR | Status: DC

## 2021-10-26 MED ORDER — LIDOCAINE HCL 2 % IJ SOLN
20.0000 mL | Freq: Once | INTRAMUSCULAR | Status: AC
  Administered 2021-10-26: 400 mg
  Filled 2021-10-26: qty 40

## 2021-10-26 NOTE — Progress Notes (Signed)
Safety precautions to be maintained throughout the outpatient stay will include: orient to surroundings, keep bed in low position, maintain call bell within reach at all times, provide assistance with transfer out of bed and ambulation.  

## 2021-10-26 NOTE — Patient Instructions (Signed)
Pain Management Discharge Instructions  General Discharge Instructions :  If you need to reach your doctor call: Monday-Friday 8:00 am - 4:00 pm at [phone] or toll free [phone].  After clinic hours [phone] to have operator reach doctor.  Bring all of your medication bottles to all your appointments in the pain clinic.  To cancel or reschedule your appointment with Pain Management please remember to call 24 hours in advance to avoid a fee.  Refer to the educational materials which you have been given on: General Risks, I had my Procedure. Discharge Instructions, Post Sedation.  Post Procedure Instructions:  The drugs you were given will stay in your system until tomorrow, so for the next 24 hours you should not drive, make any legal decisions or drink any alcoholic beverages.  You may eat anything you prefer, but it is better to start with liquids then soups and crackers, and gradually work up to solid foods.  Please notify your doctor immediately if you have any unusual bleeding, trouble breathing or pain that is not related to your normal pain.  Depending on the type of procedure that was done, some parts of your body may feel week and/or numb.  This usually clears up by tonight or the next day.  Walk with the use of an assistive device or accompanied by an adult for the 24 hours.  You may use ice on the affected area for the first 24 hours.  Put ice in a Ziploc bag and cover with a towel and place against area 15 minutes on 15 minutes off.  You may switch to heat after 24 hours.Facet Blocks Patient Information  Description: The facets are joints in the spine between the vertebrae.  Like any joints in the body, facets can become irritated and painful.  Arthritis can also effect the facets.  By injecting steroids and local anesthetic in and around these joints, we can temporarily block the nerve supply to them.  Steroids act directly on irritated nerves and tissues to  reduce selling and inflammation which often leads to decreased pain.  Facet blocks may be done anywhere along the spine from the neck to the low back depending upon the location of your pain.   After numbing the skin with local anesthetic (like Novocaine), a small needle is passed onto the facet joints under x-ray guidance.  You may experience a sensation of pressure while this is being done.  The entire block usually lasts about 15-25 minutes.   Conditions which may be treated by facet blocks:  Low back/buttock pain Neck/shoulder pain Certain types of headaches  Preparation for the injection:  Do not eat any solid food or dairy products within 8 hours of your appointment. You may drink clear liquid up to 3 hours before appointment.  Clear liquids include water, black coffee, juice or soda.  No milk or cream please. You may take your regular medication, including pain medications, with a sip of water before your appointment.  Diabetics should hold regular insulin (if taken separately) and take 1/2 normal NPH dose the morning of the procedure.  Carry some sugar containing items with you to your appointment. A driver must accompany you and be prepared to drive you home after your procedure. Bring all your current medications with you. An IV may be inserted and sedation may be given at the discretion of the physician. A blood pressure cuff, EKG and other monitors will often be applied during the procedure.  Some patients may need to   have extra oxygen administered for a short period. You will be asked to provide medical information, including your allergies and medications, prior to the procedure.  We must know immediately if you are taking blood thinners (like Coumadin/Warfarin) or if you are allergic to IV iodine contrast (dye).  We must know if you could possible be pregnant.  Possible side-effects:  Bleeding from needle site Infection (rare, may require surgery) Nerve injury (rare) Numbness  & tingling (temporary) Difficulty urinating (rare, temporary) Spinal headache (a headache worse with upright posture) Light-headedness (temporary) Pain at injection site (serveral days) Decreased blood pressure (rare, temporary) Weakness in arm/leg (temporary) Pressure sensation in back/neck (temporary)   Call if you experience:  Fever/chills associated with headache or increased back/neck pain Headache worsened by an upright position New onset, weakness or numbness of an extremity below the injection site Hives or difficulty breathing (go to the emergency room) Inflammation or drainage at the injection site(s) Severe back/neck pain greater than usual New symptoms which are concerning to you  Please note:  Although the local anesthetic injected can often make your back or neck feel good for several hours after the injection, the pain will likely return. It takes 3-7 days for steroids to work.  You may not notice any pain relief for at least one week.  If effective, we will often do a series of 2-3 injections spaced 3-6 weeks apart to maximally decrease your pain.  After the initial series, you may be a candidate for a more permanent nerve block of the facets.  If you have any questions, please call [phone] [facility] 

## 2021-10-26 NOTE — Progress Notes (Signed)
PROVIDER NOTE: Interpretation of information contained herein should be left to medically-trained personnel. Specific patient instructions are provided elsewhere under "Patient Instructions" section of medical record. This document was created in part using STT-dictation technology, any transcriptional errors that may result from this process are unintentional.  Patient: Molly Cooke Type: Established DOB: May 16, 1960 MRN: [id number] PCP: Babs BertinNasrat, Safwat, MD  Service: Procedure DOS: 10/26/2021 Setting: Ambulatory Location: Ambulatory outpatient facility Delivery: Face-to-face Provider: Edward JollyBilal Ameris Akamine, MD Specialty: Interventional Pain Management Specialty designation: 09 Location: Outpatient facility Ref. Prov.: Nasrat, Safwat, MD    Procedure:           Type: Lumbar Facet, Medial Branch Block(s) #1  Laterality: Left  Level: L3, L4, L5 Medial Branch Level(s). Injecting these levels blocks the L3-4 L4-5   lumbar facet joints. Imaging: Fluoroscopic guidance         Anesthesia: Local anesthesia (1-2% Lidocaine) DOS: 10/26/2021 Performed by: Edward JollyBilal Trevonn Hallum, MD  Primary Purpose: Diagnostic/Therapeutic Indications: Low back pain severe enough to impact quality of life or function. 1. Lumbar facet arthropathy   2. Lumbar spondylosis   3. Chronic pain syndrome    NAS-11 Pain score:   Pre-procedure: 10-Worst pain ever/10   Post-procedure: 0-No pain/10     Position / Prep / Materials:  Position: Prone  Prep solution: DuraPrep (Iodine Povacrylex [0.7% available iodine] and Isopropyl Alcohol, 74% w/w) Area Prepped: Posterolateral Lumbosacral Spine (Wide prep: From the lower border of the scapula down to the end of the tailbone and from flank to flank.)  Materials:  Tray: Block Needle(s):  Type: Spinal  Gauge (G): 22  Length: 3.5-in Qty: 2  Pre-op H&P Assessment:  Molly Cooke is a 61 y.o. (year old), female patient, seen today for interventional treatment. She  has a past surgical history  that includes Shoulder arthroscopy with bicepstenotomy (Left, 2020); Shoulder arthroscopy with bicepstenotomy (Left, 2021); Hip arthroscopy w/ labral repair (Left, 2019); Ovarian cyst removal (Right, 2000); Knee arthroscopy (Right, 1996); Tubal ligation (1985); and Neck surgery. Molly Cooke has a current medication list which includes the following prescription(s): atorvastatin, benazepril, clobetasol cream, diclofenac sodium, gabapentin, glipizide, hydromorphone, hydromorphone, lorazepam, mesalamine, methocarbamol, omeprazole, ozempic (1 mg/dose), and gabapentin. Her primarily concern today is the Back Pain (Entire torso)  Initial Vital Signs:  Pulse/HCG Rate: 88ECG Heart Rate: 91 Temp:  (!) 97.3 F (36.3 C) Resp: 18 BP:  (!) 121/100 SpO2: 100 %  BMI: Estimated body mass index is 25.75 kg/m as calculated from the following:   Height as of this encounter: 5\' 4"  (1.626 m).   Weight as of this encounter: 150 lb (68 kg).  Risk Assessment: Allergies: Reviewed. She is allergic to hydrocodone-acetaminophen, nsaids, oxycodone-acetaminophen, clemastine, oxycodone hcl, tramadol, and hydromorphone.  Allergy Precautions: None required Coagulopathies: Reviewed. None identified.  Blood-thinner therapy: None at this time Active Infection(s): Reviewed. None identified. Molly Cooke is afebrile  Site Confirmation: Molly Cooke was asked to confirm the procedure and laterality before marking the site Procedure checklist: Completed Consent: Before the procedure and under the influence of no sedative(s), amnesic(s), or anxiolytics, the patient was informed of the treatment options, risks and possible complications. To fulfill our ethical and legal obligations, as recommended by the American Medical Association's Code of Ethics, I have informed the patient of my clinical impression; the nature and purpose of the treatment or procedure; the risks, benefits, and possible complications of the intervention; the  alternatives, including doing nothing; the risk(s) and benefit(s) of the alternative treatment(s) or procedure(s); and the risk(s) and benefit(s)  of doing nothing. The patient was provided information about the general risks and possible complications associated with the procedure. These may include, but are not limited to: failure to achieve desired goals, infection, bleeding, organ or nerve damage, allergic reactions, paralysis, and death. In addition, the patient was informed of those risks and complications associated to Spine-related procedures, such as failure to decrease pain; infection (i.e.: Meningitis, epidural or intraspinal abscess); bleeding (i.e.: epidural hematoma, subarachnoid hemorrhage, or any other type of intraspinal or peri-dural bleeding); organ or nerve damage (i.e.: Any type of peripheral nerve, nerve root, or spinal cord injury) with subsequent damage to sensory, motor, and/or autonomic systems, resulting in permanent pain, numbness, and/or weakness of one or several areas of the body; allergic reactions; (i.e.: anaphylactic reaction); and/or death. Furthermore, the patient was informed of those risks and complications associated with the medications. These include, but are not limited to: allergic reactions (i.e.: anaphylactic or anaphylactoid reaction(s)); adrenal axis suppression; blood sugar elevation that in diabetics may result in ketoacidosis or comma; water retention that in patients with history of congestive heart failure may result in shortness of breath, pulmonary edema, and decompensation with resultant heart failure; weight gain; swelling or edema; medication-induced neural toxicity; particulate matter embolism and blood vessel occlusion with resultant organ, and/or nervous system infarction; and/or aseptic necrosis of one or more joints. Finally, the patient was informed that Medicine is not an exact science; therefore, there is also the possibility of unforeseen or  unpredictable risks and/or possible complications that may result in a catastrophic outcome. The patient indicated having understood very clearly. We have given the patient no guarantees and we have made no promises. Enough time was given to the patient to ask questions, all of which were answered to the patient's satisfaction. [name] has indicated that she wanted to continue with the procedure. Attestation: I, the ordering provider, attest that I have discussed with the patient the benefits, risks, side-effects, alternatives, likelihood of achieving goals, and potential problems during recovery for the procedure that I have provided informed consent. Date  Time: 10/26/2021  1:00 PM  Pre-Procedure Preparation:  Monitoring: As per clinic protocol. Respiration, ETCO2, SpO2, BP, heart rate and rhythm monitor placed and checked for adequate function Safety Precautions: Patient was assessed for positional comfort and pressure points before starting the procedure. Time-out: I initiated and conducted the "Time-out" before starting the procedure, as per protocol. The patient was asked to participate by confirming the accuracy of the "Time Out" information. Verification of the correct person, site, and procedure were performed and confirmed by me, the nursing staff, and the patient. "Time-out" conducted as per Joint Commission's Universal Protocol (UP.01.01.01). Time: 1351  Description of Procedure:          Laterality: Left Targeted Levels:   L3, L4, L5,  Medial Branch Level(s)  Safety Precautions: Aspiration looking for blood return was conducted prior to all injections. At no point did we inject any substances, as a needle was being advanced. Before injecting, the patient was told to immediately notify me if she was experiencing any new onset of "ringing in the ears, or metallic taste in the mouth". No attempts were made at seeking any paresthesias. Safe injection practices and needle disposal techniques  used. Medications properly checked for expiration dates. SDV (single dose vial) medications used. After the completion of the procedure, all disposable equipment used was discarded in the proper designated medical waste containers. Local Anesthesia: Protocol guidelines were followed. The patient was positioned over the  fluoroscopy table. The area was prepped in the usual manner. The time-out was completed. The target area was identified using fluoroscopy. A 12-in long, straight, sterile hemostat was used with fluoroscopic guidance to locate the targets for each level blocked. Once located, the skin was marked with an approved surgical skin marker. Once all sites were marked, the skin (epidermis, dermis, and hypodermis), as well as deeper tissues (fat, connective tissue and muscle) were infiltrated with a small amount of a short-acting local anesthetic, loaded on a 10cc syringe with a 25G, 1.5-in  Needle. An appropriate amount of time was allowed for local anesthetics to take effect before proceeding to the next step. Local Anesthetic: Lidocaine 2.0% The unused portion of the local anesthetic was discarded in the proper designated containers. Technical description of process:   L3 Medial Branch Nerve Block (MBB): The target area for the L3 medial branch is at the junction of the postero-lateral aspect of the superior articular process and the superior, posterior, and medial edge of the transverse process of L4. Under fluoroscopic guidance, a Quincke needle was inserted until contact was made with os over the superior postero-lateral aspect of the pedicular shadow (target area). After negative aspiration for blood, 46mL of the nerve block solution was injected without difficulty or complication. The needle was removed intact. L4 Medial Branch Nerve Block (MBB): The target area for the L4 medial branch is at the junction of the postero-lateral aspect of the superior articular process and the superior, posterior,  and medial edge of the transverse process of L5. Under fluoroscopic guidance, a Quincke needle was inserted until contact was made with os over the superior postero-lateral aspect of the pedicular shadow (target area). After negative aspiration for blood,48mL of the nerve block solution was injected without difficulty or complication. The needle was removed intact. L5 Medial Branch Nerve Block (MBB): The target area for the L5 medial branch is at the junction of the postero-lateral aspect of the superior articular process and the superior, posterior, and medial edge of the sacral ala. Under fluoroscopic guidance, a Quincke needle was inserted until contact was made with os over the superior postero-lateral aspect of the pedicular shadow (target area). After negative aspiration for blood, 46mL of the nerve block solution was injected without difficulty or complication. The needle was removed intact.   6 cc solution made of 5 cc of 0.2% ropivacaine, 1cc of Decadron 10 mg/cc.  2 cc injected at each level above on left   Once the entire procedure was completed, the treated area was cleaned, making sure to leave some of the prepping solution back to take advantage of its long term bactericidal properties.         Illustration of the posterior view of the lumbar spine and the posterior neural structures. Laminae of L2 through S1 are labeled. DPRL5, dorsal primary ramus of L5; DPRS1, dorsal primary ramus of S1; DPR3, dorsal primary ramus of L3; FJ, facet (zygapophyseal) joint L3-L4; I, inferior articular process of L4; LB1, lateral branch of dorsal primary ramus of L1; IAB, inferior articular branches from L3 medial branch (supplies L4-L5 facet joint); IBP, intermediate branch plexus; MB3, medial branch of dorsal primary ramus of L3; NR3, third lumbar nerve root; S, superior articular process of L5; SAB, superior articular branches from L4 (supplies L4-5 facet joint also); TP3, transverse process of  L3.  Vitals:   10/26/21 1317 10/26/21 1347 10/26/21 1352 10/26/21 1355  BP: 127/83 (!) 150/100 (!) 142/100 (!) 145/104  Pulse:  Resp:  18 18 20   Temp:      SpO2:  98% 97% 100%  Weight:      Height:         Start Time: 1351 hrs. End Time: 1355 hrs.  Imaging Guidance (Spinal):          Type of Imaging Technique: Fluoroscopy Guidance (Spinal) Indication(s): Assistance in needle guidance and placement for procedures requiring needle placement in or near specific anatomical locations not easily accessible without such assistance. Exposure Time: Please see nurses notes. Contrast: None used. Fluoroscopic Guidance: I was personally present during the use of fluoroscopy. "Tunnel Vision Technique" used to obtain the best possible view of the target area. Parallax error corrected before commencing the procedure. "Direction-depth-direction" technique used to introduce the needle under continuous pulsed fluoroscopy. Once target was reached, antero-posterior, oblique, and lateral fluoroscopic projection used confirm needle placement in all planes. Images permanently stored in EMR. Interpretation: No contrast injected. I personally interpreted the imaging intraoperatively. Adequate needle placement confirmed in multiple planes. Permanent images saved into the patient's record.  Antibiotic Prophylaxis:   Anti-infectives (From admission, onward)    None      Indication(s): None identified  Post-operative Assessment:  Post-procedure Vital Signs:  Pulse/HCG Rate: 8888 Temp:  (!) 97.3 F (36.3 C) Resp: 20 BP: (!) 145/104 SpO2: 100 %  EBL: None  Complications: No immediate post-treatment complications observed by team, or reported by patient.  Note: The patient tolerated the entire procedure well. A repeat set of vitals were taken after the procedure and the patient was kept under observation following institutional policy, for this type of procedure. Post-procedural neurological  assessment was performed, showing return to baseline, prior to discharge. The patient was provided with post-procedure discharge instructions, including a section on how to identify potential problems. Should any problems arise concerning this procedure, the patient was given instructions to immediately contact , at any time, without hesitation. In any case, we plan to contact the patient by telephone for a follow-up status report regarding this interventional procedure.  Comments:  No additional relevant information.  Plan of Care  Orders:  Orders Placed This Encounter  Procedures   DG PAIN [facility] C-ARM 1-60 MIN NO REPORT    Intraoperative interpretation by procedural physician at [facility].    Standing Status:   Standing    Number of Occurrences:   1    Order Specific Question:   Reason for exam:    Answer:   Assistance in needle guidance and placement for procedures requiring needle placement in or near specific anatomical locations not easily accessible without such assistance.   Compliance Drug Analysis, Ur    Volume: 30 ml(s). Minimum 3 ml of urine is needed. Document temperature of fresh sample. Indications: Long term (current) use of opiate analgesic [facility]) Test#: [phone] (Comprehensive Profile)    Order Specific Question:   Release to patient    Answer:   Immediate     Medications ordered for procedure: Meds ordered this encounter  Medications   DISCONTD: iohexol (OMNIPAQUE) 180 MG/ML injection 10 mL    Must be Myelogram-compatible. If not available, you may substitute with a water-soluble, non-ionic, hypoallergenic, myelogram-compatible radiological contrast medium.   lidocaine (XYLOCAINE) 2 % (with pres) injection 400 mg   dexamethasone (DECADRON) injection 10 mg   ropivacaine (PF) 2 mg/mL (0.2%) (NAROPIN) injection 9 mL   Medications administered: We administered lidocaine, dexamethasone, and ropivacaine (PF) 2 mg/mL (0.2%).  See the medical record for  exact dosing, route, and time of administration.  Follow-up plan:   Return in about 2 weeks (around 11/09/2021) for F2F PPE and discuss rib pain.      Left L3,4,5 MBNB #1 10/26/21    Recent Visits Date Type Provider Dept  09/01/21 Office Visit Edward Jolly, MD [facility]  Showing recent visits within past 90 days and meeting all other requirements Today's Visits Date Type Provider Dept  10/26/21 Procedure visit Edward Jolly, MD [facility]  Showing today's visits and meeting all other requirements Future Appointments Date Type Provider Dept  11/09/21 Appointment Edward Jolly, MD [facility]  Showing future appointments within next 90 days and meeting all other requirements  Disposition: Discharge home  Discharge (Date  Time): 10/26/2021; 1405 hrs.   Primary Care Physician: Babs Bertin, MD Location: [facility] Note by: Edward Jolly, MD Date: 10/26/2021; Time: 2:47 PM  Disclaimer:  Medicine is not an exact science. The only guarantee in medicine is that nothing is guaranteed. It is important to note that the decision to proceed with this intervention was based on the information collected from the patient. The Data and conclusions were drawn from the patient's questionnaire, the interview, and the physical examination. Because the information was provided in large part by the patient, it cannot be guaranteed that it has not been purposely or unconsciously manipulated. Every effort has been made to obtain as much relevant data as possible for this evaluation. It is important to note that the conclusions that lead to this procedure are derived in large part from the available data. Always take into account that the treatment will also be dependent on availability of resources and existing treatment guidelines, considered by other Pain Management Practitioners as being common knowledge and practice, at the time of the intervention.  For Medico-Legal purposes, it is also important to point out that variation in procedural techniques and pharmacological choices are the acceptable norm. The indications, contraindications, technique, and results of the above procedure should only be interpreted and judged by a Board-Certified Interventional Pain Specialist with extensive familiarity and expertise in the same exact procedure and technique.

## 2021-10-27 ENCOUNTER — Telehealth: Payer: Self-pay

## 2021-10-27 NOTE — Telephone Encounter (Signed)
States after the numbing wore off she started hurtiing, Told her that was to be expected to start heat today and waith for steroid to start working. Instructed to call back if needed.

## 2021-10-30 LAB — COMPLIANCE DRUG ANALYSIS, UR

## 2021-11-09 ENCOUNTER — Ambulatory Visit: Payer: 59 | Admitting: Student in an Organized Health Care Education/Training Program

## 2021-11-10 ENCOUNTER — Ambulatory Visit
Payer: 59 | Attending: Student in an Organized Health Care Education/Training Program | Admitting: Student in an Organized Health Care Education/Training Program

## 2021-11-10 ENCOUNTER — Encounter: Payer: Self-pay | Admitting: Student in an Organized Health Care Education/Training Program

## 2021-11-10 VITALS — BP 124/87 | HR 92 | Temp 97.0°F | Resp 16 | Ht 64.0 in | Wt 150.0 lb

## 2021-11-10 DIAGNOSIS — G894 Chronic pain syndrome: Secondary | ICD-10-CM | POA: Insufficient documentation

## 2021-11-10 DIAGNOSIS — Z8781 Personal history of (healed) traumatic fracture: Secondary | ICD-10-CM | POA: Insufficient documentation

## 2021-11-10 DIAGNOSIS — G588 Other specified mononeuropathies: Secondary | ICD-10-CM | POA: Diagnosis not present

## 2021-11-10 NOTE — Progress Notes (Signed)
Safety precautions to be maintained throughout the outpatient stay will include: orient to surroundings, keep bed in low position, maintain call bell within reach at all times, provide assistance with transfer out of bed and ambulation.  

## 2021-11-10 NOTE — Patient Instructions (Signed)
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
## 2021-11-23 ENCOUNTER — Encounter: Payer: Self-pay | Admitting: Student in an Organized Health Care Education/Training Program

## 2021-11-23 ENCOUNTER — Ambulatory Visit
Payer: 59 | Attending: Student in an Organized Health Care Education/Training Program | Admitting: Student in an Organized Health Care Education/Training Program

## 2021-11-23 ENCOUNTER — Ambulatory Visit
Admission: RE | Admit: 2021-11-23 | Discharge: 2021-11-23 | Disposition: A | Discharge status: Home / Self Care | Payer: 59 | Source: Ambulatory Visit | Attending: Student in an Organized Health Care Education/Training Program | Admitting: Student in an Organized Health Care Education/Training Program

## 2021-11-23 ENCOUNTER — Other Ambulatory Visit: Payer: Self-pay

## 2021-11-23 DIAGNOSIS — Z8781 Personal history of (healed) traumatic fracture: Secondary | ICD-10-CM | POA: Insufficient documentation

## 2021-11-23 DIAGNOSIS — G588 Other specified mononeuropathies: Secondary | ICD-10-CM | POA: Diagnosis present

## 2021-11-23 DIAGNOSIS — G894 Chronic pain syndrome: Secondary | ICD-10-CM | POA: Diagnosis present

## 2021-11-23 MED ORDER — DEXAMETHASONE SODIUM PHOSPHATE 10 MG/ML IJ SOLN
10.0000 mg | Freq: Once | INTRAMUSCULAR | Status: AC
  Administered 2021-11-23: 10 mg
  Filled 2021-11-23: qty 1

## 2021-11-23 MED ORDER — ROPIVACAINE HCL 2 MG/ML IJ SOLN
9.0000 mL | Freq: Once | INTRAMUSCULAR | Status: AC
  Administered 2021-11-23: 9 mL via PERINEURAL
  Filled 2021-11-23: qty 20

## 2021-11-23 MED ORDER — IOHEXOL 180 MG/ML  SOLN
10.0000 mL | Freq: Once | INTRAMUSCULAR | Status: AC
  Administered 2021-11-23: 10 mL via INTRA_ARTICULAR
  Filled 2021-11-23: qty 20

## 2021-11-23 MED ORDER — LIDOCAINE HCL 2 % IJ SOLN
INTRAMUSCULAR | Status: AC
  Filled 2021-11-23: qty 20

## 2021-11-23 NOTE — Progress Notes (Signed)
PROVIDER NOTE: Interpretation of information contained herein should be left to medically-trained personnel. Specific patient instructions are provided elsewhere under "Patient Instructions" section of medical record. This document was created in part using STT-dictation technology, any transcriptional errors that may result from this process are unintentional.  Patient: Molly Cooke Type: Established DOB: 09-Aug-1960 MRN: [id number] PCP: Babs Bertin, MD  Service: Procedure DOS: 11/23/2021 Setting: Ambulatory Location: Ambulatory outpatient facility Delivery: Face-to-face Provider: Edward Jolly, MD Specialty: Interventional Pain Management Specialty designation: 09 Location: Outpatient facility Ref. Prov.: Edward Jolly, MD    Primary Reason for Visit: Interventional Pain Management Treatment. CC: Abdominal Pain (Blateral intercostal fx's 10/04/21 right side are displaced )    Procedure:          Anesthesia, Analgesia, Anxiolysis:  Type: Diagnostic Posterolateral Intercostal  Nerve Block  #1  Region: Posterolateral  Thoracic Area Level: Right 4,5 and Left 5 Ribs Laterality: Bilateral as above  Anesthesia: Local (1-2% Lidocaine)  Anxiolysis: None  Sedation: None  Guidance: Fluoroscopy           Position: Prone   1. Intercostal neuralgia   2. History of rib fracture (Right 3,4 and Left 5)   3. Chronic pain syndrome    NAS-11 Pain score:   Pre-procedure: 8 /10   Post-procedure: 0-No pain/10     Pre-op H&P Assessment:  Molly Cooke is a 61 y.o. (year old), female patient, seen today for interventional treatment. She  has a past surgical history that includes Shoulder arthroscopy with bicepstenotomy (Left, 2020); Shoulder arthroscopy with bicepstenotomy (Left, 2021); Hip arthroscopy w/ labral repair (Left, 2019); Ovarian cyst removal (Right, 2000); Knee arthroscopy (Right, 1996); Tubal ligation (1985); and Neck surgery. Molly Cooke has a current medication list which includes the  following prescription(s): atorvastatin, benazepril, clobetasol cream, diclofenac sodium, gabapentin, glipizide, mesalamine, omeprazole, ozempic (1 mg/dose), trazodone, gabapentin, hydromorphone, hydromorphone, lorazepam, and methocarbamol. Her primarily concern today is the Abdominal Pain (Blateral intercostal fx's 10/04/21 right side are displaced )  Initial Vital Signs:  Pulse/HCG Rate: 79ECG Heart Rate: 69 Temp: (!) 97.3 F (36.3 C) Resp: 16 BP: 112/68 SpO2: 100 %  BMI: Estimated body mass index is 25.75 kg/m as calculated from the following:   Height as of this encounter: 5\' 4"  (1.626 m).   Weight as of this encounter: 150 lb (68 kg).  Risk Assessment: Allergies: Reviewed. She is allergic to hydrocodone-acetaminophen, nsaids, oxycodone-acetaminophen, clemastine, oxycodone hcl, and tramadol.  Allergy Precautions: None required Coagulopathies: Reviewed. None identified.  Blood-thinner therapy: None at this time Active Infection(s): Reviewed. None identified. Ms. is afebrile  Site Confirmation: Molly Cooke was asked to confirm the procedure and laterality before marking the site Procedure checklist: Completed Consent: Before the procedure and under the influence of no sedative(s), amnesic(s), or anxiolytics, the patient was informed of the treatment options, risks and possible complications. To fulfill our ethical and legal obligations, as recommended by the American Medical Association's Code of Ethics, I have informed the patient of my clinical impression; the nature and purpose of the treatment or procedure; the risks, benefits, and possible complications of the intervention; the alternatives, including doing nothing; the risk(s) and benefit(s) of the alternative treatment(s) or procedure(s); and the risk(s) and benefit(s) of doing nothing. The patient was provided information about the general risks and possible complications associated with the procedure. These may include, but  are not limited to: failure to achieve desired goals, infection, bleeding, organ or nerve damage, allergic reactions, paralysis, and death. In addition, the patient was  informed of those risks and complications associated to the procedure, such as failure to decrease pain; infection; bleeding; organ or nerve damage with subsequent damage to sensory, motor, and/or autonomic systems, resulting in permanent pain, numbness, and/or weakness of one or several areas of the body; allergic reactions; (i.e.: anaphylactic reaction); and/or death. Furthermore, the patient was informed of those risks and complications associated with the medications. These include, but are not limited to: allergic reactions (i.e.: anaphylactic or anaphylactoid reaction(s)); adrenal axis suppression; blood sugar elevation that in diabetics may result in ketoacidosis or comma; water retention that in patients with history of congestive heart failure may result in shortness of breath, pulmonary edema, and decompensation with resultant heart failure; weight gain; swelling or edema; medication-induced neural toxicity; particulate matter embolism and blood vessel occlusion with resultant organ, and/or nervous system infarction; and/or aseptic necrosis of one or more joints. Finally, the patient was informed that Medicine is not an exact science; therefore, there is also the possibility of unforeseen or unpredictable risks and/or possible complications that may result in a catastrophic outcome. The patient indicated having understood very clearly. We have given the patient no guarantees and we have made no promises. Enough time was given to the patient to ask questions, all of which were answered to the patient's satisfaction. Molly Cooke has indicated that she wanted to continue with the procedure. Attestation: I, the ordering provider, attest that I have discussed with the patient the benefits, risks, side-effects, alternatives, likelihood of  achieving goals, and potential problems during recovery for the procedure that I have provided informed consent. Date  Time: 11/23/2021  8:09 AM  Pre-Procedure Preparation:  Monitoring: As per clinic protocol. Respiration, ETCO2, SpO2, BP, heart rate and rhythm monitor placed and checked for adequate function Safety Precautions: Patient was assessed for positional comfort and pressure points before starting the procedure. Time-out: I initiated and conducted the "Time-out" before starting the procedure, as per protocol. The patient was asked to participate by confirming the accuracy of the "Time Out" information. Verification of the correct person, site, and procedure were performed and confirmed by me, the nursing staff, and the patient. "Time-out" conducted as per Joint Commission's Universal Protocol (UP.01.01.01). Time: 8101  Description of Procedure:          Target Area: The sub-costal neurovascular bundle area Approach: Sub-costal approach Area Prepped: Entire Posterolateral  Thoracic Region DuraPrep (Iodine Povacrylex [0.7% available iodine] and Isopropyl Alcohol, 74% w/w) Safety Precautions: Aspiration looking for blood return was conducted prior to all injections. At no point did we inject any substances, as a needle was being advanced. No attempts were made at seeking any paresthesias. Safe injection practices and needle disposal techniques used. Medications properly checked for expiration dates. SDV (single dose vial) medications used. Description of the Procedure: Protocol guidelines were followed. The patient was placed in position over the procedure table. The target area was identified and the area prepped in the usual manner. Skin & deeper tissues infiltrated with local anesthetic. After cleaning the skin with an antiseptic solution, 1-2 mL of dilute local anesthetic was infiltrated subcutaneously at the planned injection site. The fingers of the palpating hand were used to straddle  the insertion site at the inferior border of the rib and fix the skin to avoid unwanted skin movement. Appropriate amount of time allowed to pass for local anesthetics to take effect. The procedure needles were then advanced to the target area at an angle of approximately 20 cephalad to the skin. Contact with  the rib was made. While maintaining the same angle of insertion, the needle was walked off the inferior border of the rib as the skin was allowed to return to its initial position. Then the needle was advanced no more than 3 mm below the inferior margin of the rib. Proper needle placement was secured. Negative aspiration confirmed. Following negative aspiration for blood or air, 3-5 mL of local anesthetic was injected. The solution injected in intermittent fashion, asking for systemic symptoms every 0.5cc of injectate. The needle was then removed and the area cleansed, making sure to leave some of the prepping solution back to take advantage of its long term bactericidal properties. Vitals:   11/23/21 0842 11/23/21 0847 11/23/21 0852 11/23/21 0857  BP: 121/86 127/86 (!) 139/93 (!) 137/90  Pulse:      Resp: 18 18 16 18   Temp:      TempSrc:      SpO2: 97% 95% 97% 96%  Weight:      Height:        Start Time: 0841 hrs. End Time: 0857 hrs.   Materials:  Needle(s) Type: Spinal needle Gauge: 25G Length: 3.5-in Medication(s): Please see orders for medications and dosing details.  9 cc solution made of 8 cc of 0.2% ropivacaine, 1 cc of Decadron 10 mg/cc.  3 cc injected for each intercostal nerve above after contrast spread.  Imaging Guidance (Non-Spinal):          Type of Imaging Technique: Fluoroscopy Guidance (Non-Spinal) Indication(s): Assistance in needle guidance and placement for procedures requiring needle placement in or near specific anatomical locations not easily accessible without such assistance. Exposure Time: Please see nurses notes. Contrast: Before injecting any contrast, we  confirmed that the patient did not have an allergy to iodine, shellfish, or radiological contrast. Once satisfactory needle placement was completed at the desired level, radiological contrast was injected. Contrast injected under live fluoroscopy. No contrast complications. See chart for type and volume of contrast used. Fluoroscopic Guidance: I was personally present during the use of fluoroscopy. "Tunnel Vision Technique" used to obtain the best possible view of the target area. Parallax error corrected before commencing the procedure. "Direction-depth-direction" technique used to introduce the needle under continuous pulsed fluoroscopy. Once target was reached, antero-posterior, oblique, and lateral fluoroscopic projection used confirm needle placement in all planes. Images permanently stored in EMR. Interpretation: I personally interpreted the imaging intraoperatively. Adequate needle placement confirmed in multiple planes. Appropriate spread of contrast into desired area was observed. No evidence of afferent or efferent intravascular uptake. Permanent images saved into the patient's record.  Post-operative Assessment:  Post-procedure Vital Signs:  Pulse/HCG Rate: 7973 Temp:  (!) 97.3 F (36.3 C) Resp: 18 BP:  (!) 137/90 SpO2: 96 %  EBL: None  Complications: No immediate post-treatment complications observed by team, or reported by patient.  Note: The patient tolerated the entire procedure well. A repeat set of vitals were taken after the procedure and the patient was kept under observation following institutional policy, for this type of procedure. Post-procedural neurological assessment was performed, showing return to baseline, prior to discharge. The patient was provided with post-procedure discharge instructions, including a section on how to identify potential problems. Should any problems arise concerning this procedure, the patient was given instructions to immediately contact , at  any time, without hesitation. In any case, we plan to contact the patient by telephone for a follow-up status report regarding this interventional procedure.  Comments:  No additional relevant information.  Plan of  Care  Orders:  Orders Placed This Encounter  Procedures   DG PAIN CLINIC C-ARM 1-60 MIN NO REPORT    Intraoperative interpretation by procedural physician at [facility].    Standing Status:   Standing    Number of Occurrences:   1    Order Specific Question:   Reason for exam:    Answer:   Assistance in needle guidance and placement for procedures requiring needle placement in or near specific anatomical locations not easily accessible without such assistance.    Medications ordered for procedure: Meds ordered this encounter  Medications   iohexol (OMNIPAQUE) 180 MG/ML injection 10 mL    Must be Myelogram-compatible. If not available, you may substitute with a water-soluble, non-ionic, hypoallergenic, myelogram-compatible radiological contrast medium.   dexamethasone (DECADRON) injection 10 mg   ropivacaine (PF) 2 mg/mL (0.2%) (NAROPIN) injection 9 mL   Medications administered: We administered iohexol, dexamethasone, and ropivacaine (PF) 2 mg/mL (0.2%).  See the medical record for exact dosing, route, and time of administration.  Follow-up plan:   Return in about 4 weeks (around 12/21/2021) for Post Procedure Evaluation, virtual.       Left L3,4,5 MBNB #1 10/26/21, right and left intercostal nerve block 11/23/2021    Recent Visits Date Type Provider Dept  11/10/21 Office Visit Edward Jolly, MD [facility]  10/26/21 Procedure visit Edward Jolly, MD [facility]  09/01/21 Office Visit Edward Jolly, MD [facility]  Showing recent visits within past 90 days and meeting all other requirements Today's Visits Date Type Provider Dept  11/23/21 Procedure visit Edward Jolly, MD [facility]  Showing today's visits and  meeting all other requirements Future Appointments Date Type Provider Dept  12/21/21 Appointment Edward Jolly, MD [facility]  Showing future appointments within next 90 days and meeting all other requirements  Disposition: Discharge home  Discharge (Date  Time): 11/23/2021; 0910 hrs.   Primary Care Physician: Babs Bertin, MD Location: [facility] Note by: Edward Jolly, MD Date: 11/23/2021; Time: 10:08 AM  Disclaimer:  Medicine is not an exact science. The only guarantee in medicine is that nothing is guaranteed. It is important to note that the decision to proceed with this intervention was based on the information collected from the patient. The Data and conclusions were drawn from the patient's questionnaire, the interview, and the physical examination. Because the information was provided in large part by the patient, it cannot be guaranteed that it has not been purposely or unconsciously manipulated. Every effort has been made to obtain as much relevant data as possible for this evaluation. It is important to note that the conclusions that lead to this procedure are derived in large part from the available data. Always take into account that the treatment will also be dependent on availability of resources and existing treatment guidelines, considered by other Pain Management Practitioners as being common knowledge and practice, at the time of the intervention. For Medico-Legal purposes, it is also important to point out that variation in procedural techniques and pharmacological choices are the acceptable norm. The indications, contraindications, technique, and results of the above procedure should only be interpreted and judged by a Board-Certified Interventional Pain Specialist with extensive familiarity and expertise in the same exact procedure and technique.

## 2021-11-23 NOTE — Patient Instructions (Signed)
Pain Management Discharge Instructions  General Discharge Instructions :  If you need to reach your doctor call: Monday-Friday 8:00 am - 4:00 pm at [phone] or toll free [phone].  After clinic hours [phone] to have operator reach doctor.  Bring all of your medication bottles to all your appointments in the pain clinic.  To cancel or reschedule your appointment with Pain Management please remember to call 24 hours in advance to avoid a fee.  Refer to the educational materials which you have been given on: General Risks, I had my Procedure. Discharge Instructions, Post Sedation.  Post Procedure Instructions:  The drugs you were given will stay in your system until tomorrow, so for the next 24 hours you should not drive, make any legal decisions or drink any alcoholic beverages.  You may eat anything you prefer, but it is better to start with liquids then soups and crackers, and gradually work up to solid foods.  Please notify your doctor immediately if you have any unusual bleeding, trouble breathing or pain that is not related to your normal pain.  Depending on the type of procedure that was done, some parts of your body may feel week and/or numb.  This usually clears up by tonight or the next day.  Walk with the use of an assistive device or accompanied by an adult for the 24 hours.  You may use ice on the affected area for the first 24 hours.  Put ice in a Ziploc bag and cover with a towel and place against area 15 minutes on 15 minutes off.  You may switch to heat after 24 hours.Intercostal Nerve Block Patient Information  Description: The twelve intercostal nerves arise from the first thru twelfth thoracic nerve roots.  The nerve begins at the spine and wraps around the body, lying in a groove underneath each rib.  Each intercostal nerve innervates a specific strip of skin and body walk of the abdomen and chest.  Therefore, injuries of the chest wall or abdominal wall  result in pain that is transmitted back to the brian via the intercostal nerves.  Examples of such injuries include rib fractures and incisions for lung and gall bladder surgery.  Occasionally, pain may persist long after an injury or surgical incision secondary to inflammation and irritation of the intercostal nerve.  The longstanding pain is known as intercostal neuralgia.  An intercostal nerve block is preformed to eliminate pain either temporarily or permanently.  A small needle is placed below the rib and local anesthetic (like Novocaine) and possibly steroid is injected.  Usually 2-4 intercostal nerves are blocked at a time depending on the problem.  The patient will experience a slight "pin-prick" sensation for each injection.  Shortly thereafter, the strip of skin that is innervated by the blocked intercostal nerve will feel numb.  Persistent pain that is only temporarily relieved with local anesthetic may require a more permanent block. This procedure is called Cryoneurolysis and entails placing a small probe beneath the rib to freeze the nerve.  Conditions that may be treated by intercostal nerve blocks:  Rib fractures Longstanding pain from surgery of the chest or abdomen (intercostal neuralgia) Pain from chest tubes Pain from trauma to the chest  Preparation for the injections:  Do not eat any solid food or dairy products within 8 hours of your appointment. You may drink clear liquids up to 3 hours before appointment.  Clear liquids include water, black coffee, juice or soda.  No milk or cream please.   You may take your regular medication, including pain medications, with a sip of water before your appointment.  Diabetics should hold regular insulin (if take separately) and take 1/2 normal NPH dose the morning of the procedure.   Carry some sugar containing items with you to your appointment. A driver must accompany you and be prepared to drive you home after your procedure. Bring all your  current medications with you. An IV may be inserted and sedation may be given at the discretion of the physician. A blood pressure cuff, EKG and other monitors will often be applied during the procedure.  Some patients may need to have extra oxygen administered for a short period. You will be asked to provide medical information, including your allergies, prior to the procedure.  We must know immediately if you are taking blood thinners (like Coumadin/Warfarin) or if you are allergic to IV iodine contrast (dye). We must know if you could possible be pregnant.  Possible side-effects:  Bleeding from needle site Infection (rare) Nerve injury (rare) Numbness & tingling of skin Collapsed lung requiring chest tube (rare) Local anesthetic toxicity (rare) Light-headedness (temporary) Pain at injection site (several days) Decreased blood pressure (temporary) Shortness of breath Jittery/shaking sensation (temporary)  Call if you experience:  Difficulty breathing or hives (go directly to the emergency room) Redness, inflammation or drainage at the injection site Severe pain at the site of the injection Any new symptoms which are concerning   Please note:  Your pain may subside immediately but may return several hours after the injection.  Often, more than one injection is required to reduce the pain. Also, if several temporary blocks with local anesthetic are ineffective, a more permanent block with cryolysis may be necessary.  This will be discussed with you should this be the case.  If you have any questions, please call [phone] [facility]     

## 2021-11-23 NOTE — Progress Notes (Signed)
Safety precautions to be maintained throughout the outpatient stay will include: orient to surroundings, keep bed in low position, maintain call bell within reach at all times, provide assistance with transfer out of bed and ambulation.  

## 2021-11-24 ENCOUNTER — Telehealth: Payer: Self-pay

## 2021-11-24 NOTE — Telephone Encounter (Signed)
Post procedure phone call.  Patient states  she didn't sleep last night.  Explained that the steroid could have been keeping her u p.  States she is still having a little pressure but the shooting pain has gone away.  Instructed patient to document on pain diary and to call us back for any questions or concerns.

## 2021-11-24 NOTE — Telephone Encounter (Signed)
Dr Cherylann Ratel, the patient is requesting a referral to PT for an FCE.  I dont know if this is something that you would take care of.

## 2021-11-24 NOTE — Telephone Encounter (Signed)
Spoke with patient and notified her of above.

## 2021-12-21 ENCOUNTER — Ambulatory Visit
Payer: 59 | Attending: Student in an Organized Health Care Education/Training Program | Admitting: Student in an Organized Health Care Education/Training Program

## 2021-12-21 DIAGNOSIS — Z8781 Personal history of (healed) traumatic fracture: Secondary | ICD-10-CM

## 2021-12-21 DIAGNOSIS — G894 Chronic pain syndrome: Secondary | ICD-10-CM

## 2021-12-21 DIAGNOSIS — G588 Other specified mononeuropathies: Secondary | ICD-10-CM

## 2021-12-21 NOTE — Progress Notes (Signed)
Patient: Molly Cooke  Service Category: E/M  Provider: Gillis Santa, MD  DOB: 1960-05-14  DOS: 12/21/2021  Location: Office  MRN: [id number]  Setting: Ambulatory outpatient  Referring Provider: Tor Netters, MD  Type: Established Patient  Specialty: Interventional Pain Management  PCP: Tor Netters, MD  Location: Remote location  Delivery: TeleHealth     Virtual Encounter - Pain Management PROVIDER NOTE: Information contained herein reflects review and annotations entered in association with encounter. Interpretation of such information and data should be left to medically-trained personnel. Information provided to patient can be located elsewhere in the medical record under "Patient Instructions". Document created using STT-dictation technology, any transcriptional errors that may result from process are unintentional.    Contact & Pharmacy Preferred: [phone] Home: [phone] (home) Mobile: [phone] (mobile) E-mail: [email or web address]  [id number] [address], [place] - [address] [place] [address] Phone: [phone] Fax: [phone]   Pre-screening  Molly Cooke offered "in-person" vs "virtual" encounter. She indicated preferring virtual for this encounter.   Reason COVID-19*  Social distancing based on CDC and AMA recommendations.   I contacted Molly Cooke on 12/21/2021 via telephone.      I clearly identified myself as Gillis Santa, MD. I verified that I was speaking with the correct person using two identifiers (Name: Molly Cooke, and date of birth: Aug 23, 1960).  Consent I sought verbal advanced consent from Molly Cooke for virtual visit interactions. I informed Molly Cooke of possible security and privacy concerns, risks, and limitations associated with providing "not-in-person" medical evaluation and management services. I also informed Molly Cooke of the availability of "in-person" appointments. Finally, I  informed her that there would be a charge for the virtual visit and that she could be  personally, fully or partially, financially responsible for it. Molly Cooke expressed understanding and agreed to proceed.   Historic Elements   Molly Cooke is a 61 y.o. year old, female patient evaluated today after our last contact on 11/23/2021. Molly Cooke  has a past medical history of Diabetes mellitus without complication ([place]), Hyperlipidemia, and Hypertension. She also  has a past surgical history that includes Shoulder arthroscopy with bicepstenotomy (Left, 2020); Shoulder arthroscopy with bicepstenotomy (Left, 2021); Hip arthroscopy w/ labral repair (Left, 2019); Ovarian cyst removal (Right, 2000); Knee arthroscopy (Right, 1996); Tubal ligation (1985); and Neck surgery. Molly Cooke has a current medication list which includes the following prescription(s): atorvastatin, benazepril, clobetasol cream, diclofenac sodium, gabapentin, glipizide, lidocaine, mesalamine, omeprazole, ozempic (1 mg/dose), trazodone, benazepril, gabapentin, hydromorphone, hydromorphone, lorazepam, and methocarbamol. She  reports that she quit smoking about 15 years ago. Her smoking use included cigarettes. She has never used smokeless tobacco. She reports current alcohol use of about 2.0 standard drinks of alcohol per week. She reports that she does not use drugs. Molly Cooke is allergic to hydrocodone-acetaminophen, nsaids, oxycodone-acetaminophen, clemastine, oxycodone hcl, and tramadol.   HPI  Today, she is being contacted for a post-procedure assessment.   Post-procedure evaluation    Procedure:          Anesthesia, Analgesia, Anxiolysis:  Type: Diagnostic Posterolateral Intercostal  Nerve Block  #1  Region: Posterolateral  Thoracic Area Level: Right 4,5 and Left 5 Ribs Laterality: Bilateral as above  Anesthesia: Local (1-2% Lidocaine)  Anxiolysis: None  Sedation: None  Guidance: Fluoroscopy           Position: Prone   1.  Intercostal neuralgia   2. History of rib fracture (Right 3,4  and Left 5)   3. Chronic pain syndrome    NAS-11 Pain score:   Pre-procedure: 8 /10   Post-procedure: 0-No pain/10      Effectiveness:  Initial hour after procedure: 100 %  Subsequent 4-6 hours post-procedure: 100 % (patient reports that she experienced a lot of pressure for the first 48 hours.)  Analgesia past initial 6 hours: 100 % (every now and then she gets shooting pains and pressure at the sternum like when she had the accident.  This has just started to reoccur within the past couple of days.)  Ongoing improvement:  Analgesic:  100% Function: Molly Cooke reports improvement in function ROM: Molly Cooke reports improvement in ROM     Assessment  The primary encounter diagnosis was Intercostal neuralgia. Diagnoses of History of rib fracture (Right 3,4 and Left 5) and Chronic pain syndrome were also pertinent to this visit.  Plan of Care  Excellent pain relief after previous intercostal nerve block.  Discussed future treatment plan which may include pulsed RFA of the intercostal nerves. Endorses increased left pelvic pain.  She was seen [facility] where a MRI of her lumbar and sacral region was ordered.  She is also going to get a thoracic MRI at that time.  I informed her to contact my clinic for a face-to-face appointment after those imaging studies for [place] to review and discuss treatment plan.  Patient endorsed understanding.    Follow-up plan:   Return for Patient will call to schedule face-to-face appointment after she has completed her MRI.     Left L3,4,5 MBNB #1 10/26/21, right and left intercostal nerve block 11/23/2021     Recent Visits Date Type Provider Dept  11/23/21 Procedure visit Gillis Santa, MD [facility]  11/10/21 Office Visit Gillis Santa, MD [facility]  10/26/21 Procedure visit Gillis Santa, MD [facility]  Showing recent visits within past 90  days and meeting all other requirements Today's Visits Date Type Provider Dept  12/21/21 Office Visit Gillis Santa, MD [facility]  Showing today's visits and meeting all other requirements Future Appointments No visits were found meeting these conditions. Showing future appointments within next 90 days and meeting all other requirements  I discussed the assessment and treatment plan with the patient. The patient was provided an opportunity to ask questions and all were answered. The patient agreed with the plan and demonstrated an understanding of the instructions.  Patient advised to call back or seek an in-person evaluation if the symptoms or condition worsens.  Duration of encounter: 31minutes.  Note by: Gillis Santa, MD Date: 12/21/2021; Time: 3:23 PM

## 2022-01-16 ENCOUNTER — Ambulatory Visit
Payer: 59 | Attending: Student in an Organized Health Care Education/Training Program | Admitting: Student in an Organized Health Care Education/Training Program

## 2022-01-16 ENCOUNTER — Encounter: Payer: Self-pay | Admitting: Student in an Organized Health Care Education/Training Program

## 2022-01-16 VITALS — BP 135/93 | HR 86 | Temp 98.1°F | Resp 17 | Ht 64.0 in | Wt 155.0 lb

## 2022-01-16 DIAGNOSIS — M47818 Spondylosis without myelopathy or radiculopathy, sacral and sacrococcygeal region: Secondary | ICD-10-CM | POA: Insufficient documentation

## 2022-01-16 DIAGNOSIS — G894 Chronic pain syndrome: Secondary | ICD-10-CM | POA: Insufficient documentation

## 2022-01-16 DIAGNOSIS — M47816 Spondylosis without myelopathy or radiculopathy, lumbar region: Secondary | ICD-10-CM | POA: Diagnosis present

## 2022-01-16 NOTE — Progress Notes (Signed)
Safety precautions to be maintained throughout the outpatient stay will include: orient to surroundings, keep bed in low position, maintain call bell within reach at all times, provide assistance with transfer out of bed and ambulation.  

## 2022-01-16 NOTE — Patient Instructions (Signed)
GENERAL RISKS AND COMPLICATIONS  What are the risk, side effects and possible complications? Generally speaking, most procedures are safe.  However, with any procedure there are risks, side effects, and the possibility of complications.  The risks and complications are dependent upon the sites that are lesioned, or the type of nerve block to be performed.  The closer the procedure is to the spine, the more serious the risks are.  Great care is taken when placing the radio frequency needles, block needles or lesioning probes, but sometimes complications can occur. Infection: Any time there is an injection through the skin, there is a risk of infection.  This is why sterile conditions are used for these blocks.  There are four possible types of infection. Localized skin infection. Central Nervous System Infection-This can be in the form of Meningitis, which can be deadly. Epidural Infections-This can be in the form of an epidural abscess, which can cause pressure inside of the spine, causing compression of the spinal cord with subsequent paralysis. This would require an emergency surgery to decompress, and there are no guarantees that the patient would recover from the paralysis. Discitis-This is an infection of the intervertebral discs.  It occurs in about 1% of discography procedures.  It is difficult to treat and it may lead to surgery.        2. Pain: the needles have to go through skin and soft tissues, will cause soreness.       3. Damage to internal structures:  The nerves to be lesioned may be near blood vessels or    other nerves which can be potentially damaged.       4. Bleeding: Bleeding is more common if the patient is taking blood thinners such as  aspirin, Coumadin, Ticiid, Plavix, etc., or if he/she have some genetic predisposition  such as hemophilia. Bleeding into the spinal canal can cause compression of the spinal  cord with subsequent paralysis.  This would require an emergency  surgery to  decompress and there are no guarantees that the patient would recover from the  paralysis.       5. Pneumothorax:  Puncturing of a lung is a possibility, every time a needle is introduced in  the area of the chest or upper back.  Pneumothorax refers to free air around the  collapsed lung(s), inside of the thoracic cavity (chest cavity).  Another two possible  complications related to a similar event would include: Hemothorax and Chylothorax.   These are variations of the Pneumothorax, where instead of air around the collapsed  lung(s), you may have blood or chyle, respectively.       6. Spinal headaches: They may occur with any procedures in the area of the spine.       7. Persistent CSF (Cerebro-Spinal Fluid) leakage: This is a rare problem, but may occur  with prolonged intrathecal or epidural catheters either due to the formation of a fistulous  track or a dural tear.       8. Nerve damage: By working so close to the spinal cord, there is always a possibility of  nerve damage, which could be as serious as a permanent spinal cord injury with  paralysis.       9. Death:  Although rare, severe deadly allergic reactions known as "Anaphylactic  reaction" can occur to any of the medications used.      10. Worsening of the symptoms:  We can always make thing worse.  What are the chances   of something like this happening? Chances of any of this occuring are extremely low.  By statistics, you have more of a chance of getting killed in a motor vehicle accident: while driving to the hospital than any of the above occurring .  Nevertheless, you should be aware that they are possibilities.  In general, it is similar to taking a shower.  Everybody knows that you can slip, hit your head and get killed.  Does that mean that you should not shower again?  Nevertheless always keep in mind that statistics do not mean anything if you happen to be on the wrong side of them.  Even if a procedure has a 1 (one) in a  1,000,000 (million) chance of going wrong, it you happen to be that one..Also, keep in mind that by statistics, you have more of a chance of having something go wrong when taking medications.  Who should not have this procedure? If you are on a blood thinning medication (e.g. Coumadin, Plavix, see list of "Blood Thinners"), or if you have an active infection going on, you should not have the procedure.  If you are taking any blood thinners, please inform your physician.  How should I prepare for this procedure? Do not eat or drink anything at least six hours prior to the procedure. Bring a driver with you .  It cannot be a taxi. Come accompanied by an adult that can drive you back, and that is strong enough to help you if your legs get weak or numb from the local anesthetic. Take all of your medicines the morning of the procedure with just enough water to swallow them. If you have diabetes, make sure that you are scheduled to have your procedure done first thing in the morning, whenever possible. If you have diabetes, take only half of your insulin dose and notify our nurse that you have done so as soon as you arrive at the clinic. If you are diabetic, but only take blood sugar pills (oral hypoglycemic), then do not take them on the morning of your procedure.  You may take them after you have had the procedure. Do not take aspirin or any aspirin-containing medications, at least eleven (11) days prior to the procedure.  They may prolong bleeding. Wear loose fitting clothing that may be easy to take off and that you would not mind if it got stained with Betadine or blood. Do not wear any jewelry or perfume Remove any nail coloring.  It will interfere with some of our monitoring equipment.  NOTE: Remember that this is not meant to be interpreted as a complete list of all possible complications.  Unforeseen problems may occur.  BLOOD THINNERS The following drugs contain aspirin or other products,  which can cause increased bleeding during surgery and should not be taken for 2 weeks prior to and 1 week after surgery.  If you should need take something for relief of minor pain, you may take acetaminophen which is found in Tylenol,m Datril, Anacin-3 and Panadol. It is not blood thinner. The products listed below are.  Do not take any of the products listed below in addition to any listed on your instruction sheet.  A.P.C or A.P.C with Codeine Codeine Phosphate Capsules #3 Ibuprofen Ridaura  ABC compound Congesprin Imuran rimadil  Advil Cope Indocin Robaxisal  Alka-Seltzer Effervescent Pain Reliever and Antacid Coricidin or Coricidin-D  Indomethacin Rufen  Alka-Seltzer plus Cold Medicine Cosprin Ketoprofen S-A-C Tablets  Anacin Analgesic Tablets or Capsules Coumadin   Korlgesic Salflex  Anacin Extra Strength Analgesic tablets or capsules CP-2 Tablets Lanoril Salicylate  Anaprox Cuprimine Capsules Levenox Salocol  Anexsia-D Dalteparin Magan Salsalate  Anodynos Darvon compound Magnesium Salicylate Sine-off  Ansaid Dasin Capsules Magsal Sodium Salicylate  Anturane Depen Capsules Marnal Soma  APF Arthritis pain formula Dewitt's Pills Measurin Stanback  Argesic Dia-Gesic Meclofenamic Sulfinpyrazone  Arthritis Bayer Timed Release Aspirin Diclofenac Meclomen Sulindac  Arthritis pain formula Anacin Dicumarol Medipren Supac  Analgesic (Safety coated) Arthralgen Diffunasal Mefanamic Suprofen  Arthritis Strength Bufferin Dihydrocodeine Mepro Compound Suprol  Arthropan liquid Dopirydamole Methcarbomol with Aspirin Synalgos  ASA tablets/Enseals Disalcid Micrainin Tagament  Ascriptin Doan's Midol Talwin  Ascriptin A/D Dolene Mobidin Tanderil  Ascriptin Extra Strength Dolobid Moblgesic Ticlid  Ascriptin with Codeine Doloprin or Doloprin with Codeine Momentum Tolectin  Asperbuf Duoprin Mono-gesic Trendar  Aspergum Duradyne Motrin or Motrin IB Triminicin  Aspirin plain, buffered or enteric coated  Durasal Myochrisine Trigesic  Aspirin Suppositories Easprin Nalfon Trillsate  Aspirin with Codeine Ecotrin Regular or Extra Strength Naprosyn Uracel  Atromid-S Efficin Naproxen Ursinus  Auranofin Capsules Elmiron Neocylate Vanquish  Axotal Emagrin Norgesic Verin  Azathioprine Empirin or Empirin with Codeine Normiflo Vitamin E  Azolid Emprazil Nuprin Voltaren  Bayer Aspirin plain, buffered or children's or timed BC Tablets or powders Encaprin Orgaran Warfarin Sodium  Buff-a-Comp Enoxaparin Orudis Zorpin  Buff-a-Comp with Codeine Equegesic Os-Cal-Gesic   Buffaprin Excedrin plain, buffered or Extra Strength Oxalid   Bufferin Arthritis Strength Feldene Oxphenbutazone   Bufferin plain or Extra Strength Feldene Capsules Oxycodone with Aspirin   Bufferin with Codeine Fenoprofen Fenoprofen Pabalate or Pabalate-SF   Buffets II Flogesic Panagesic   Buffinol plain or Extra Strength Florinal or Florinal with Codeine Panwarfarin   Buf-Tabs Flurbiprofen Penicillamine   Butalbital Compound Four-way cold tablets Penicillin   Butazolidin Fragmin Pepto-Bismol   Carbenicillin Geminisyn Percodan   Carna Arthritis Reliever Geopen Persantine   Carprofen Gold's salt Persistin   Chloramphenicol Goody's Phenylbutazone   Chloromycetin Haltrain Piroxlcam   Clmetidine heparin Plaquenil   Cllnoril Hyco-pap Ponstel   Clofibrate Hydroxy chloroquine Propoxyphen         Before stopping any of these medications, be sure to consult the physician who ordered them.  Some, such as Coumadin (Warfarin) are ordered to prevent or treat serious conditions such as "deep thrombosis", "pumonary embolisms", and other heart problems.  The amount of time that you may need off of the medication may also vary with the medication and the reason for which you were taking it.  If you are taking any of these medications, please make sure you notify your pain physician before you undergo any procedures.         Facet Blocks Patient  Information  Description: The facets are joints in the spine between the vertebrae.  Like any joints in the body, facets can become irritated and painful.  Arthritis can also effect the facets.  By injecting steroids and local anesthetic in and around these joints, we can temporarily block the nerve supply to them.  Steroids act directly on irritated nerves and tissues to reduce selling and inflammation which often leads to decreased pain.  Facet blocks may be done anywhere along the spine from the neck to the low back depending upon the location of your pain.   After numbing the skin with local anesthetic (like Novocaine), a small needle is passed onto the facet joints under x-ray guidance.  You may experience a sensation of pressure while this is being done.  The   entire block usually lasts about 15-25 minutes.   Conditions which may be treated by facet blocks:  Low back/buttock pain Neck/shoulder pain Certain types of headaches  Preparation for the injection:  Do not eat any solid food or dairy products within 8 hours of your appointment. You may drink clear liquid up to 3 hours before appointment.  Clear liquids include water, black coffee, juice or soda.  No milk or cream please. You may take your regular medication, including pain medications, with a sip of water before your appointment.  Diabetics should hold regular insulin (if taken separately) and take 1/2 normal NPH dose the morning of the procedure.  Carry some sugar containing items with you to your appointment. A driver must accompany you and be prepared to drive you home after your procedure. Bring all your current medications with you. An IV may be inserted and sedation may be given at the discretion of the physician. A blood pressure cuff, EKG and other monitors will often be applied during the procedure.  Some patients may need to have extra oxygen administered for a short period. You will be asked to provide medical information,  including your allergies and medications, prior to the procedure.  We must know immediately if you are taking blood thinners (like Coumadin/Warfarin) or if you are allergic to IV iodine contrast (dye).  We must know if you could possible be pregnant.  Possible side-effects:  Bleeding from needle site Infection (rare, may require surgery) Nerve injury (rare) Numbness & tingling (temporary) Difficulty urinating (rare, temporary) Spinal headache (a headache worse with upright posture) Light-headedness (temporary) Pain at injection site (serveral days) Decreased blood pressure (rare, temporary) Weakness in arm/leg (temporary) Pressure sensation in back/neck (temporary)   Call if you experience:  Fever/chills associated with headache or increased back/neck pain Headache worsened by an upright position New onset, weakness or numbness of an extremity below the injection site Hives or difficulty breathing (go to the emergency room) Inflammation or drainage at the injection site(s) Severe back/neck pain greater than usual New symptoms which are concerning to you  Please note:  Although the local anesthetic injected can often make your back or neck feel good for several hours after the injection, the pain will likely return. It takes 3-7 days for steroids to work.  You may not notice any pain relief for at least one week.  If effective, we will often do a series of 2-3 injections spaced 3-6 weeks apart to maximally decrease your pain.  After the initial series, you may be a candidate for a more permanent nerve block of the facets.  If you have any questions, please call [phone] [facility] 

## 2022-01-16 NOTE — Progress Notes (Signed)
PROVIDER NOTE: Information contained herein reflects review and annotations entered in association with encounter. Interpretation of such information and data should be left to medically-trained personnel. Information provided to patient can be located elsewhere in the medical record under "Patient Instructions". Document created using STT-dictation technology, any transcriptional errors that may result from process are unintentional.    Patient: Molly Cooke  Service Category: E/M  Provider: Gillis Santa, MD  DOB: 07-19-1960  DOS: 01/16/2022  Referring Provider: Tor Netters, MD  MRN: [id number]  Specialty: Interventional Pain Management  PCP: Tor Netters, MD  Type: Established Patient  Setting: Ambulatory outpatient    Location: Office  Delivery: Face-to-face     HPI  Molly Cooke, a 61 y.o. year old female, is here today because of her Lumbar facet arthropathy [M47.816]. Molly Cooke primary complain today is Hip Pain (left) Last encounter: My last encounter with her was on 12/21/2021. Pertinent problems: Molly Cooke has Cervical facet joint syndrome; Chronic pain syndrome; Cervicalgia; Cervical radicular pain; Lumbar facet arthropathy; Intercostal neuralgia; and History of rib fracture (Right 3,4 and Left 5) on their pertinent problem list. Pain Assessment: Severity of Chronic pain is reported as a 8 /10. Location: Hip Left/radiates down inside of left thigh to knee. Onset: More than a month ago. Quality: Constant, Other (Comment), Discomfort, Shooting, Pressure, Sore (stinging). Timing: Constant. Modifying factor(s): recvlining. Vitals:  height is '5\' 4"'  (1.626 m) and weight is 155 lb (70.3 kg). Her temporal temperature is 98.1 F (36.7 C). Her blood pressure is 135/93 (abnormal) and her pulse is 86. Her respiration is 17 and oxygen saturation is 100%.   Reason for encounter:  Increased low back pain with radiates into left hip.  Molly Cooke presents today for worsening left low back pain  that is worse with lumbar extension and facet loading with occasional radiation into her left buttock.  She states that she gets pain relief when she is supine but has noticeably increased pain when she is standing up or walking.  We reviewed her lumbar and sacral MRI below.  She is status post left L3, L4, L5 medial branch nerve block on October 26, 2021 that provided her with 100% pain relief for approximately 2 months.  Given return of her axial low back pain and a decline in her functional status, we discussed repeating nerve block #2 and then considering lumbar radiofrequency ablation for the purpose of obtaining longer-term pain relief.  Risk and benefits reviewed and patient would like to proceed.  ROS  Constitutional: Denies any fever or chills Gastrointestinal: No reported hemesis, hematochezia, vomiting, or acute GI distress Musculoskeletal:  Low back pain, worse on the left with facet loading Neurological: No reported episodes of acute onset apraxia, aphasia, dysarthria, agnosia, amnesia, paralysis, loss of coordination, or loss of consciousness  Medication Review  HYDROmorphone, LORazepam, NON FORMULARY, Semaglutide (1 MG/DOSE), atorvastatin, benazepril, clobetasol cream, diclofenac Sodium, gabapentin, glipiZIDE, lidocaine, mesalamine, methocarbamol, omeprazole, tiZANidine, and traZODone  History Review  Allergy: Molly Cooke is allergic to hydrocodone-acetaminophen, nsaids, oxycodone-acetaminophen, clemastine, oxycodone hcl, and tramadol. Drug: Molly Cooke  reports no history of drug use. Alcohol:  reports current alcohol use of about 2.0 standard drinks of alcohol per week. Tobacco:  reports that she quit smoking about 15 years ago. Her smoking use included cigarettes. She has never used smokeless tobacco. Social: Molly Cooke  reports that she quit smoking about 15 years ago. Her smoking use included cigarettes. She has never used smokeless tobacco. She reports current alcohol use of about  2.0  standard drinks of alcohol per week. She reports that she does not use drugs. Medical:  has a past medical history of Diabetes mellitus without complication ([place]), Hyperlipidemia, and Hypertension. Surgical: Molly Cooke  has a past surgical history that includes Shoulder arthroscopy with bicepstenotomy (Left, 2020); Shoulder arthroscopy with bicepstenotomy (Left, 2021); Hip arthroscopy w/ labral repair (Left, 2019); Ovarian cyst removal (Right, 2000); Knee arthroscopy (Right, 1996); Tubal ligation (1985); and Neck surgery. Family: family history includes Dementia in her mother.  Laboratory Chemistry Profile   Renal No results found for: "BUN", "CREATININE", "LABCREA", "BCR", "GFR", "GFRAA", "GFRNONAA", "LABVMA", "EPIRU", "EPINEPH24HUR", "NOREPRU", "NOREPI24HUR", "DOPARU", "DOPAM24HRUR"  Hepatic No results found for: "AST", "ALT", "ALBUMIN", "ALKPHOS", "HCVAB", "AMYLASE", "LIPASE", "AMMONIA"  Electrolytes No results found for: "NA", "K", "CL", "CALCIUM", "MG", "PHOS"  Bone No results found for: "VD25OH", "VD125OH2TOT", "[id number]", "[id number]", "25OHVITD1", "25OHVITD2", "25OHVITD3", "TESTOFREE", "TESTOSTERONE"  Inflammation (CRP: Acute Phase) (ESR: Chronic Phase) No results found for: "CRP", "ESRSEDRATE", "LATICACIDVEN"       Note: Above Lab results reviewed.  Recent Imaging Review   Lumbar spine:  5 nonrib-bearing lumbar vertebral bodies visualized.  No acute fracture or malalignment.  Vertebral body heights well-maintained.  Disc spaces are well-maintained. Mild lower lumbar spine facet arthrosis.  Soft tissues are unremarkable.    EXAM: MRI LUMBAR SPINE WITHOUT CONTRAST MRI OF SACRUM WITHOUT CONTRAST.   INDICATION: 61 years old Female with Low back pain, trauma, M53.3  Sacrococcygeal disorders, not elsewhere classified.   COMPARISON:  Lumbar spine radiographs 10/01/2019.   TECHNIQUE: Axial and sagittal images of the lumbar spine were obtained  using multiple pulse sequences. Axial,  coronal and sagittal scans of the  sacrum were obtained.   FINDINGS:  Partially visualized T2 hyperintense structure involving the  left kidney measuring up to 5.5 cm likely to be a cyst.   Segmentation: Normal based on prior lumbar spine radiographs. There is a  vestigial disc between S1 and S2.   Conus medullaris: No enlargement, masses or signal abnormality.   Vertebral Column: Alignment is normal.  No fractures or marrow  abnormalities noted.   T12-L1: Small left paracentral disc without spinal canal stenosis or  foramina stenosis.   L1-L2: No evidence of disc degeneration or herniation.  There is no  evidence of canal or foraminal stenosis.   L2-L3: No evidence of disc degeneration or herniation.  There is no  evidence of canal or foraminal stenosis.   L3-L4: No evidence of disc degeneration or herniation.  There is no  evidence of canal or foraminal stenosis.   L4-L5: No evidence of disc degeneration or herniation.  There is no  evidence of canal or foraminal stenosis.   L5-S1: Moderate disc degeneration with small central, bilateral paracentral  disc protrusion and associated annular tearing indent the thecal sac. No  spinal canal stenosis or foramina stenosis.   Sacrum and coccyx is are normal. No soft tissue or bone marrow signal  abnormality to suggest fractures. Arcuate lines are intact. Minor increased  T2, and STIR signal intensity measuring less than a centimeter at the  inferior aspect of the iliac bone adjacent to the left SI joint likely  related to minor degenerative changes. No subcutaneous hematoma/seroma.   Other findings: Pelvic scans are otherwise unremarkable.   IMPRESSION:  1. Degenerative changes at L5-S1 without spinal canal stenosis or foramina  stenosis.  2. No fractures.  3. Minimal degenerative changes at the inferior most aspect of the left SI  joint.  Electronically Signed by:  Lester [place], MD, [facility]   Electronically Signed on:  01/13/2022 10:51 AM Procedure Note  Debria Garret, MD - 01/13/2022  Formatting of this note might be different from the original.  EXAM: MRI LUMBAR SPINE WITHOUT CONTRAST MRI OF SACRUM WITHOUT CONTRAST.   INDICATION: 61 years old Female with Low back pain, trauma, M53.3  Sacrococcygeal disorders, not elsewhere classified.   COMPARISON:  Lumbar spine radiographs 10/01/2019.   TECHNIQUE: Axial and sagittal images of the lumbar spine were obtained  using multiple pulse sequences. Axial, coronal and sagittal scans of the  sacrum were obtained.   FINDINGS:  Partially visualized T2 hyperintense structure involving the  left kidney measuring up to 5.5 cm likely to be a cyst.   Segmentation: Normal based on prior lumbar spine radiographs. There is a  vestigial disc between S1 and S2.   Conus medullaris: No enlargement, masses or signal abnormality.   Vertebral Column: Alignment is normal.  No fractures or marrow  abnormalities noted.   T12-L1: Small left paracentral disc without spinal canal stenosis or  foramina stenosis.   L1-L2: No evidence of disc degeneration or herniation.  There is no  evidence of canal or foraminal stenosis.   L2-L3: No evidence of disc degeneration or herniation.  There is no  evidence of canal or foraminal stenosis.   L3-L4: No evidence of disc degeneration or herniation.  There is no  evidence of canal or foraminal stenosis.   L4-L5: No evidence of disc degeneration or herniation.  There is no  evidence of canal or foraminal stenosis.   L5-S1: Moderate disc degeneration with small central, bilateral paracentral  disc protrusion and associated annular tearing indent the thecal sac. No  spinal canal stenosis or foramina stenosis.   Sacrum and coccyx is are normal. No soft tissue or bone marrow signal  abnormality to suggest fractures. Arcuate lines are intact. Minor increased  T2, and STIR signal intensity  measuring less than a centimeter at the  inferior aspect of the iliac bone adjacent to the left SI joint likely  related to minor degenerative changes. No subcutaneous hematoma/seroma.   Other findings: Pelvic scans are otherwise unremarkable.   IMPRESSION:  1. Degenerative changes at L5-S1 without spinal canal stenosis or foramina  stenosis.  2. No fractures.  3. Minimal degenerative changes at the inferior most aspect of the left SI  joint.   Physical Exam  General appearance: Well nourished, well developed, and well hydrated. In no apparent acute distress Mental status: Alert, oriented x 3 (person, place, & time)       Respiratory: No evidence of acute respiratory distress Eyes: PERLA Vitals: BP (!) 135/93   Pulse 86   Temp 98.1 F (36.7 C) (Temporal)   Resp 17   Ht '5\' 4"'  (1.626 m)   Wt 155 lb (70.3 kg)   SpO2 100%   BMI 26.61 kg/m  BMI: Estimated body mass index is 26.61 kg/m as calculated from the following:   Height as of this encounter: '5\' 4"'  (1.626 m).   Weight as of this encounter: 155 lb (70.3 kg). Ideal: Ideal body weight: 54.7 kg (120 lb 9.5 oz) Adjusted ideal body weight: 60.9 kg (134 lb 5.7 oz)    Lumbar Spine Area Exam  Skin & Axial Inspection: No masses, redness, or swelling Alignment: Symmetrical Functional ROM: Pain restricted ROM affecting primarily the left Stability: No instability detected Muscle Tone/Strength: Functionally intact. No obvious neuro-muscular anomalies detected. Sensory (Neurological): Musculoskeletal  pain pattern affecting the left Palpation: No palpable anomalies       Provocative Tests: Hyperextension/rotation test: (+) on the left for facet joint pain. Lumbar quadrant test (Kemp's test): (+) on the left for facet joint pain.   5 out of 5 strength bilateral lower extremity: Plantar flexion, dorsiflexion, knee flexion, knee extension.  Assessment   Diagnosis Status  1. Lumbar facet arthropathy   2. Lumbar spondylosis   3.  SI joint arthritis (LEFT)   4. Chronic pain syndrome    Flare-up Flare-up Controlled   Updated Problems: Problem  Lumbar Facet Arthropathy  SI joint arthritis (LEFT)     Plan of Care  Status post positive diagnostic left L3, L4, L5 facet medial branch nerve block 1 on 10/26/2021.  Provided 100% pain relief for approximately 2 months.  Given return of axial low back pain that is consistent with lumbar MRI findings, which we reviewed in great detail today, recommend repeating left L3, L4, L5 medial branch nerve block #2 and then considering left L3, L4, L5 radiofrequency ablation for the purpose of obtaining longer-term pain relief.  Risk and benefits reviewed and patient would like to proceed.  We will plan on doing this without sedation.  Orders:  Orders Placed This Encounter  Procedures   LUMBAR FACET(MEDIAL BRANCH NERVE BLOCK) MBNB    Standing Status:   Future    Standing Expiration Date:   04/18/2022    Scheduling Instructions:     Procedure: Lumbar facet block (AKA.: Lumbosacral medial branch nerve block)     Side: Bilateral     Level: L3-4 & L4-5 Facets ( L3, L4, L5, Medial Branch Nerves)     Sedation: without     Timeframe: ASAA    Order Specific Question:   Where will this procedure be performed?    Answer:   [facility]   Follow-up plan:   Return in about 16 days (around 02/01/2022) for Left L3, 4, 5 MBNB #2, in clinic NS.     Left L3,4,5 MBNB #1 10/26/21: 100% pain relief for 2 months, right and left intercostal nerve block 11/23/2021: 80% pain relief that is ongoing      Recent Visits Date Type Provider Dept  12/21/21 Office Visit Gillis Santa, MD [facility]  11/23/21 Procedure visit Gillis Santa, MD [facility]  11/10/21 Office Visit Gillis Santa, MD [facility]  10/26/21 Procedure visit Gillis Santa, MD [facility]  Showing recent visits within past 90 days and meeting all other requirements Today's Visits Date  Type Provider Dept  01/16/22 Office Visit Gillis Santa, MD [facility]  Showing today's visits and meeting all other requirements Future Appointments No visits were found meeting these conditions. Showing future appointments within next 90 days and meeting all other requirements  I discussed the assessment and treatment plan with the patient. The patient was provided an opportunity to ask questions and all were answered. The patient agreed with the plan and demonstrated an understanding of the instructions.  Patient advised to call back or seek an in-person evaluation if the symptoms or condition worsens.  Duration of encounter: 59mnutes.  Total time on encounter, as per AMA guidelines included both the face-to-face and non-face-to-face time personally spent by the physician and/or other qualified health care professional(s) on the day of the encounter (includes time in activities that require the physician or other qualified health care professional and does not include time in activities normally performed by clinical staff).  Physician's time may include the following activities when performed: preparing to see the patient (eg, review of tests, pre-charting review of records) obtaining and/or reviewing separately obtained history performing a medically appropriate examination and/or evaluation counseling and educating the patient/family/caregiver ordering medications, tests, or procedures referring and communicating with other health care professionals (when not separately reported) documenting clinical information in the electronic or other health record independently interpreting results (not separately reported) and communicating results to the patient/ family/caregiver care coordination (not separately reported)  Note by: Gillis Santa, MD Date: 01/16/2022; Time: 2:31 PM

## 2022-01-26 ENCOUNTER — Telehealth: Payer: Self-pay

## 2022-01-26 NOTE — Telephone Encounter (Signed)
Since the insurance denied her procedure she wants to know if she can get some pain medicine.She states she has to have dilaudid because she is allergic to everything else.

## 2022-01-26 NOTE — Telephone Encounter (Signed)
This patient had an appointment on 01-16-22.  She is requesting Dilaudid since her procedure was denied.  Do you recommend another appointment to discuss?

## 2022-01-30 ENCOUNTER — Telehealth: Payer: Self-pay | Admitting: Student in an Organized Health Care Education/Training Program

## 2022-01-30 NOTE — Telephone Encounter (Signed)
Patient advised that our office does not write prescriptions for acute pain. Advised her to call surgeon or PCP.

## 2022-01-30 NOTE — Telephone Encounter (Signed)
Patient spoke with her shoulder surgeon. He wants to write her Tylenol with codeine. She wants to make sure this is ok with Dr Holley Raring before she gets any scripts. She says she is not allergic to this medication.

## 2022-05-31 ENCOUNTER — Ambulatory Visit (INDEPENDENT_AMBULATORY_CARE_PROVIDER_SITE_OTHER): Payer: Medicaid Other | Admitting: Licensed Practical Nurse

## 2022-05-31 ENCOUNTER — Other Ambulatory Visit (HOSPITAL_COMMUNITY)
Admission: RE | Admit: 2022-05-31 | Discharge: 2022-05-31 | Disposition: A | Discharge status: Home / Self Care | Payer: Medicaid Other | Source: Ambulatory Visit | Attending: Licensed Practical Nurse | Admitting: Licensed Practical Nurse

## 2022-05-31 VITALS — BP 164/91 | HR 89 | Ht 64.0 in | Wt 153.5 lb

## 2022-05-31 DIAGNOSIS — N6459 Other signs and symptoms in breast: Secondary | ICD-10-CM

## 2022-05-31 DIAGNOSIS — N898 Other specified noninflammatory disorders of vagina: Secondary | ICD-10-CM

## 2022-05-31 DIAGNOSIS — N6452 Nipple discharge: Secondary | ICD-10-CM

## 2022-05-31 DIAGNOSIS — N952 Postmenopausal atrophic vaginitis: Secondary | ICD-10-CM

## 2022-06-01 DIAGNOSIS — N6452 Nipple discharge: Secondary | ICD-10-CM | POA: Insufficient documentation

## 2022-06-01 DIAGNOSIS — N6459 Other signs and symptoms in breast: Secondary | ICD-10-CM | POA: Insufficient documentation

## 2022-06-01 NOTE — Progress Notes (Signed)
SUBJECTIVE: Here for evaluation for vaginal discharge and bleeding from the Left Nipple  Vaginal Discharge: has been happening for 2 weeks, it is "heavy", yellow to clear in color, sometimes she has a burning sensation at the opening of her vagina. There is an "old blood" odor, She has put an ointment on her vaginal tissue-something she was prescribed for a similar complaint she is not sure what is it. Normally she uses Boric Acid when she has these symptoms, this normally improves symptoms. She has not yet done the boric acid. Today she sat in the tub for 4 hours-mainly as a method of pain control for other medical conditions.  Denies any vaginal itching. She has not had IC since 2022. She last had a cycle at age 62.   Nipple discharge: this has been happening for the last 2 years, she was previously evaluated and was told nothing is wrong but told she should have 6 month follow up. She has not been able to get that follow. Her PCP has ordered the breast US/mammogram but  for some reason she cannot get the imaging scheduled. She notices bleeding daily when she changes her bra, it sometimes sticks to her bra. If she squeezes her nipple, she is able to see blood. Denies any trauma to her breasts.   OBJECTIVE: BP (!) 164/91   Pulse 89   Ht '5\' 4"'$  (1.626 m)   Wt 153 lb 8 oz (69.6 kg)   BMI 26.35 kg/m   Gen: NAD Breasts:  Right breast: no masses or redness, Nipple intact Left breast: no masses or redness. Nipple appears to have a healing scab, no obvious discharge.  External: vaginal atrophy, some redness noted at introitus. Otherwise normal exam  SSE cervix pink no lesions, creamy white discharge present   PAP 06/2021 NILM, neg HPV  Previous studies 11/30/2020: RECOMMENDATION: 1. Annual screening mammograms. Next bilateral screening mammogram will be due in August of 2023. 2. Causes of unilateral nipple discharge include: Hormonal changes, fibrocystic changes, benign papilloma, abscess/mastitis,  birth control pills, endocrine disorders, injury/trauma to breast, duct ectasia, medications, prolactinoma, and breast cancer. As is evident from this list, nipple discharge often stems from a benign condition, however, breast cancer is a possibility when unilateral spontaneous persistent single duct discharge (especially bloody or clear discharge) is present. 3. If nipple discharge persists, surgical consultation for central duct excision might become necessary for definitive diagnosis and/or definitive therapy.   BI-RADS CATEGORY  2: Benign.  ASSESSMENT: Vaginal discharge Bleeding from Right Nipple Vaginal atrophy  PLAN: -Aptima swab collected. Reviewed burning sensation could be from vaginal atrophy, we could consider treatment with estrogen cream. Pt prefers to continue to with her usual care or soaks and Boric Acid. -Breast US and mammogram ordered.   Roberto Scales, [facility]  06/01/22  8:18 PM

## 2022-06-02 LAB — CERVICOVAGINAL ANCILLARY ONLY
Bacterial Vaginitis (gardnerella): NEGATIVE
Candida Glabrata: NEGATIVE
Candida Vaginitis: NEGATIVE
Chlamydia: NEGATIVE
Comment: NEGATIVE
Comment: NEGATIVE
Comment: NEGATIVE
Comment: NEGATIVE
Comment: NEGATIVE
Comment: NORMAL
Neisseria Gonorrhea: NEGATIVE
Trichomonas: NEGATIVE

## 2022-07-03 NOTE — Progress Notes (Unsigned)
ANNUAL PREVENTATIVE CARE GYNECOLOGY  ENCOUNTER NOTE  Subjective:       Molly Cooke is a 62 y.o. No obstetric history on file. female here for a routine annual gynecologic exam. The patient {is/is not/has never been:13135} sexually active. The patient {is/is not:13135} taking hormone replacement therapy. {post-men bleed:13152::"Patient denies post-menopausal vaginal bleeding."} The patient wears seatbelts: {yes/no:311178}. The patient participates in regular exercise: {yes/no/not asked:9010}. Has the patient ever been transfused or tattooed?: {yes/no/not asked:9010}. The patient reports that there {is/is not:9024} domestic violence in her life.  Current complaints: 1.  ***    Gynecologic History No LMP recorded. Patient has had an ablation. Contraception: {method:5051} Last Pap: ***. Results were: {norm/abn:16337} Last mammogram: ***. Results were: {norm/abn:16337} Last Colonoscopy:  Last Dexa Scan:    Obstetric History OB History  No obstetric history on file.    Past Medical History:  Diagnosis Date   Diabetes mellitus without complication (HCC)    Hyperlipidemia    Hypertension     Family History  Problem Relation Age of Onset   Dementia Mother     Past Surgical History:  Procedure Laterality Date   HIP ARTHROSCOPY W/ LABRAL REPAIR Left 2019   KNEE ARTHROSCOPY Right 1996   NECK SURGERY     OVARIAN CYST REMOVAL Right 2000   SHOULDER ARTHROSCOPY WITH BICEPSTENOTOMY Left 2020   SHOULDER ARTHROSCOPY WITH BICEPSTENOTOMY Left 2021   TUBAL LIGATION  1985    Social History   Socioeconomic History   Marital status: Widowed    Spouse name: Not on file   Number of children: 4   Years of education: 14   Highest education level: Associate degree: academic program  Occupational History   Not on file  Tobacco Use   Smoking status: Former    Types: Cigarettes    Quit date: 2008    Years since quitting: 16.2   Smokeless tobacco: Never  Vaping Use   Vaping  Use: Never used  Substance and Sexual Activity   Alcohol use: Yes    Alcohol/week: 2.0 standard drinks of alcohol    Types: 2 Standard drinks or equivalent per week   Drug use: Never   Sexual activity: Not Currently  Other Topics Concern   Not on file  Social History Narrative   Not on file   Social Determinants of Health   Financial Resource Strain: Not on file  Food Insecurity: Not on file  Transportation Needs: Not on file  Physical Activity: Not on file  Stress: Not on file  Social Connections: Not on file  Intimate Partner Violence: Not on file    Current Outpatient Medications on File Prior to Visit  Medication Sig Dispense Refill   atorvastatin (LIPITOR) 10 MG tablet Take 40 mg by mouth at bedtime.     benazepril (LOTENSIN) 10 MG tablet Take 10 mg by mouth daily.     clobetasol cream (TEMOVATE) 0.05 % Apply 1 application. topically 2 (two) times daily as needed. To labia sparingly and only when needed (Patient not taking: Reported on 05/31/2022) 15 g 0   diclofenac Sodium (VOLTAREN) 1 % GEL Apply 2 g topically as needed.     gabapentin (NEURONTIN) 100 MG capsule Take 300 mg by mouth 4 (four) times daily.     glipiZIDE (GLUCOTROL XL) 2.5 MG 24 hr tablet Take 2.5 mg by mouth every morning.     HYDROmorphone (DILAUDID) 2 MG tablet Take 6 mg by mouth at bedtime. (Patient not taking: Reported on 11/10/2021)  HYDROmorphone (DILAUDID) 4 MG tablet Take by mouth every 4 (four) hours as needed for severe pain.     lidocaine 4 % Place 1 patch onto the skin as needed.     LORazepam (ATIVAN) 0.5 MG tablet Take 0.5 mg by mouth at bedtime.     mesalamine (CANASA) 1000 MG suppository Place 1,000 mg rectally at bedtime as needed.     NON FORMULARY Delta 8 gummies     OZEMPIC, 1 MG/DOSE, 4 MG/3ML SOPN Inject 1 mg into the skin once a week.     tiZANidine (ZANAFLEX) 4 MG capsule Take 4 mg by mouth daily.     traZODone (DESYREL) 50 MG tablet Take 50 mg by mouth at bedtime.     No current  facility-administered medications on file prior to visit.    Allergies  Allergen Reactions   Hydrocodone-Acetaminophen Shortness Of Breath   Nsaids Other (See Comments)    GI bleed and ulcers   Oxycodone-Acetaminophen Itching and Other (See Comments)   Clemastine Other (See Comments)    Chest pain    Oxycodone Hcl Itching   Tramadol Itching      Review of Systems ROS Review of Systems - General ROS: negative for - chills, fatigue, fever, hot flashes, night sweats, weight gain or weight loss Psychological ROS: negative for - anxiety, decreased libido, depression, mood swings, physical abuse or sexual abuse Ophthalmic ROS: negative for - blurry vision, eye pain or loss of vision ENT ROS: negative for - headaches, hearing change, visual changes or vocal changes Allergy and Immunology ROS: negative for - hives, itchy/watery eyes or seasonal allergies Hematological and Lymphatic ROS: negative for - bleeding problems, bruising, swollen lymph nodes or weight loss Endocrine ROS: negative for - galactorrhea, hair pattern changes, hot flashes, malaise/lethargy, mood swings, palpitations, polydipsia/polyuria, skin changes, temperature intolerance or unexpected weight changes Breast ROS: negative for - new or changing breast lumps or nipple discharge Respiratory ROS: negative for - cough or shortness of breath Cardiovascular ROS: negative for - chest pain, irregular heartbeat, palpitations or shortness of breath Gastrointestinal ROS: no abdominal pain, change in bowel habits, or black or bloody stools Genito-Urinary ROS: no dysuria, trouble voiding, or hematuria Musculoskeletal ROS: negative for - joint pain or joint stiffness Neurological ROS: negative for - bowel and bladder control changes Dermatological ROS: negative for rash and skin lesion changes   Objective:   There were no vitals taken for this visit. CONSTITUTIONAL: Well-developed, well-nourished female in no acute distress.   PSYCHIATRIC: Normal mood and affect. Normal behavior. Normal judgment and thought content. NEUROLGIC: Alert and oriented to person, place, and time. Normal muscle tone coordination. No cranial nerve deficit noted. HENT:  Normocephalic, atraumatic, External right and left ear normal. Oropharynx is clear and moist EYES: Conjunctivae and EOM are normal. Pupils are equal, round, and reactive to light. No scleral icterus.  NECK: Normal range of motion, supple, no masses.  Normal thyroid.  SKIN: Skin is warm and dry. No rash noted. Not diaphoretic. No erythema. No pallor. CARDIOVASCULAR: Normal heart rate noted, regular rhythm, no murmur. RESPIRATORY: Clear to auscultation bilaterally. Effort and breath sounds normal, no problems with respiration noted. BREASTS: Symmetric in size. No masses, skin changes, nipple drainage, or lymphadenopathy. ABDOMEN: Soft, normal bowel sounds, no distention noted.  No tenderness, rebound or guarding.  BLADDER: Normal PELVIC:  Bladder {:311640}  Urethra: {:311719}  Vulva: {:311722}  Vagina: {:311643}  Cervix: {:311644}  Uterus: {:311718}  Adnexa: {:311645}  RV: {Blank multiple:19196::"External Exam NormaI","No  Rectal Masses","Normal Sphincter tone"}  MUSCULOSKELETAL: Normal range of motion. No tenderness.  No cyanosis, clubbing, or edema.  2+ distal pulses. LYMPHATIC: No Axillary, Supraclavicular, or Inguinal Adenopathy.   Labs: No results found for: "WBC", "HGB", "HCT", "MCV", "PLT"  No results found for: "CREATININE", "BUN", "NA", "K", "CL", "CO2"  No results found for: "ALT", "AST", "GGT", "ALKPHOS", "BILITOT"  No results found for: "CHOL", "HDL", "LDLCALC", "LDLDIRECT", "TRIG", "CHOLHDL"  No results found for: "TSH"  No results found for: "HGBA1C"   Assessment:   No diagnosis found.   Plan:  Pap: {Blank multiple:19196::"Pap, Reflex if ASCUS","Pap Co Test","GC/CT NAAT","Not needed","Not done"} Mammogram: {Blank  multiple:19196::"***","Ordered","Not Ordered","Not Indicated"} Colon Screening:  {Blank multiple:19196::"***","Ordered","Not Ordered","Not Indicated"} Labs: {Blank multiple:19196::"Lipid 1","FBS","TSH","Hemoglobin A1C","Vit D Level""***"} Routine preventative health maintenance measures emphasized: {Blank multiple:19196::"Exercise/Diet/Weight control","Tobacco Warnings","Alcohol/Substance use risks","Stress Management","Peer Pressure Issues","Safe Sex"} COVID Vaccination status: Return to Clinic - 1 Year   Loney LaurenceSabina H Yazmyn Valbuena, [facility]MA [facility]

## 2022-07-06 ENCOUNTER — Encounter: Payer: Self-pay | Admitting: Obstetrics

## 2022-07-06 ENCOUNTER — Ambulatory Visit (INDEPENDENT_AMBULATORY_CARE_PROVIDER_SITE_OTHER): Payer: Medicaid Other | Admitting: Obstetrics

## 2022-07-06 VITALS — BP 141/86 | HR 80 | Ht 64.0 in | Wt 155.0 lb

## 2022-07-06 DIAGNOSIS — Z01419 Encounter for gynecological examination (general) (routine) without abnormal findings: Secondary | ICD-10-CM | POA: Diagnosis not present

## 2022-07-06 DIAGNOSIS — Z0000 Encounter for general adult medical examination without abnormal findings: Secondary | ICD-10-CM

## 2022-07-06 DIAGNOSIS — N6452 Nipple discharge: Secondary | ICD-10-CM

## 2022-07-06 NOTE — Addendum Note (Signed)
Addended by: [name] on: 07/06/2022 05:54 PM   Modules accepted: Orders

## 2022-07-12 ENCOUNTER — Other Ambulatory Visit: Payer: Self-pay | Admitting: *Deleted

## 2022-07-12 ENCOUNTER — Inpatient Hospital Stay
Admission: RE | Admit: 2022-07-12 | Discharge: 2022-07-12 | Disposition: A | Discharge status: Home / Self Care | Payer: Self-pay | Source: Ambulatory Visit | Attending: Licensed Practical Nurse | Admitting: Licensed Practical Nurse

## 2022-07-12 DIAGNOSIS — Z1231 Encounter for screening mammogram for malignant neoplasm of breast: Secondary | ICD-10-CM

## 2022-07-21 ENCOUNTER — Other Ambulatory Visit: Payer: Self-pay | Admitting: Licensed Practical Nurse

## 2022-07-21 ENCOUNTER — Ambulatory Visit
Admission: RE | Admit: 2022-07-21 | Discharge: 2022-07-21 | Disposition: A | Discharge status: Home / Self Care | Payer: Medicaid Other | Source: Ambulatory Visit | Attending: Licensed Practical Nurse | Admitting: Licensed Practical Nurse

## 2022-07-21 DIAGNOSIS — N6452 Nipple discharge: Secondary | ICD-10-CM | POA: Diagnosis present

## 2022-07-21 DIAGNOSIS — N63 Unspecified lump in unspecified breast: Secondary | ICD-10-CM

## 2022-07-21 DIAGNOSIS — R928 Other abnormal and inconclusive findings on diagnostic imaging of breast: Secondary | ICD-10-CM

## 2022-08-01 ENCOUNTER — Ambulatory Visit
Admission: RE | Admit: 2022-08-01 | Discharge: 2022-08-01 | Disposition: A | Discharge status: Home / Self Care | Payer: Medicaid Other | Source: Ambulatory Visit | Attending: Licensed Practical Nurse | Admitting: Licensed Practical Nurse

## 2022-08-01 DIAGNOSIS — N6012 Diffuse cystic mastopathy of left breast: Secondary | ICD-10-CM | POA: Insufficient documentation

## 2022-08-01 DIAGNOSIS — N63 Unspecified lump in unspecified breast: Secondary | ICD-10-CM | POA: Insufficient documentation

## 2022-08-01 DIAGNOSIS — N6325 Unspecified lump in the left breast, overlapping quadrants: Secondary | ICD-10-CM | POA: Diagnosis present

## 2022-08-01 DIAGNOSIS — R928 Other abnormal and inconclusive findings on diagnostic imaging of breast: Secondary | ICD-10-CM | POA: Insufficient documentation

## 2022-08-01 DIAGNOSIS — N6082 Other benign mammary dysplasias of left breast: Secondary | ICD-10-CM | POA: Diagnosis not present

## 2022-08-01 HISTORY — PX: BREAST BIOPSY: SHX20

## 2022-08-01 MED ORDER — LIDOCAINE-EPINEPHRINE (PF) 1 %-1:200000 IJ SOLN
10.0000 mL | Freq: Once | INTRAMUSCULAR | Status: AC
  Administered 2022-08-01: 10 mL via INTRADERMAL
  Filled 2022-08-01: qty 10

## 2022-08-02 LAB — SURGICAL PATHOLOGY

## 2022-08-14 ENCOUNTER — Encounter: Payer: Self-pay | Admitting: Surgery

## 2022-08-14 ENCOUNTER — Ambulatory Visit (INDEPENDENT_AMBULATORY_CARE_PROVIDER_SITE_OTHER): Payer: Medicare Other | Admitting: Surgery

## 2022-08-14 VITALS — BP 135/85 | HR 91 | Temp 98.0°F | Ht 64.0 in | Wt 154.8 lb

## 2022-08-14 DIAGNOSIS — N6459 Other signs and symptoms in breast: Secondary | ICD-10-CM

## 2022-08-14 DIAGNOSIS — K625 Hemorrhage of anus and rectum: Secondary | ICD-10-CM

## 2022-08-14 DIAGNOSIS — N6452 Nipple discharge: Secondary | ICD-10-CM

## 2022-08-14 DIAGNOSIS — Z800 Family history of malignant neoplasm of digestive organs: Secondary | ICD-10-CM | POA: Diagnosis not present

## 2022-08-14 NOTE — Patient Instructions (Addendum)
We will send a referral to [facility] for a colonoscopy. They will call you to schedule this appointment.   We will get you scheduled for an MRI of the breast to better assess the area. We will have you follow up here after we get your results.

## 2022-08-16 NOTE — Progress Notes (Signed)
Patient ID: Molly Cooke, female   DOB: [birth year], 62 y.o.   MRN: [id number]  HPI Molly Cooke is a 62 y.o. female in consultation for left nipple discharge close to 2 years.  She reports that the nipple is also retracted and DC is spontaneous , it is a serous discharge.  She reports no breast pain no palpable masses. SHe sees also at the pain clinic. Did have a mammogram and ultrasound that I have personally reviewed showing an ultrasound hypoechoic mass within the nipple.  This last mass was biopsied PATHOLOGY revealed: DIAGNOSIS: A. 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 NEGATIVE FOR ATYPICAL PROLIFERATIVE BREAST DISEASE She is diabetic and hypertensive.  He is a former smoker.  HPI  Past Medical History:  Diagnosis Date   Diabetes mellitus without complication (HCC)    Hyperlipidemia    Hypertension     Past Surgical History:  Procedure Laterality Date   BREAST BIOPSY Left 08/01/2022   [place] Bx, Ribbon Clip, path pending   BREAST BIOPSY Left 08/01/2022   [place] LT BREAST BX W LOC DEV 1ST LESION IMG BX SPEC US GUIDE 08/01/2022 [facility]   HIP ARTHROSCOPY W/ LABRAL REPAIR Left 2019   KNEE ARTHROSCOPY Right 1996   NECK SURGERY     OVARIAN CYST REMOVAL Right 2000   SHOULDER ARTHROSCOPY WITH BICEPSTENOTOMY Left 2020   SHOULDER ARTHROSCOPY WITH BICEPSTENOTOMY Left 2021   TUBAL LIGATION  1985    Family History  Problem Relation Age of Onset   Dementia Mother     Social History Social History   Tobacco Use   Smoking status: Former    Types: Cigarettes    Quit date: 2008    Years since quitting: 16.4    Passive exposure: Past   Smokeless tobacco: Never  Vaping Use   Vaping Use: Never used  Substance Use Topics   Alcohol use: Yes    Alcohol/week: 2.0 standard drinks of alcohol    Types: 2 Standard drinks or equivalent per week   Drug use: Never     Allergies  Allergen Reactions   Hydrocodone-Acetaminophen Shortness Of Breath   Nsaids Other (See Comments)    GI bleed and ulcers   Oxycodone-Acetaminophen Itching and Other (See Comments)   Clemastine Other (See Comments)    Chest pain    Oxycodone Hcl Itching   Tramadol Itching    Current Outpatient Medications  Medication Sig Dispense Refill   acetaminophen-codeine (TYLENOL #3) 300-30 MG tablet Take 1 tablet by mouth daily as needed.     atorvastatin (LIPITOR) 10 MG tablet Take 40 mg by mouth at bedtime.     benazepril (LOTENSIN) 10 MG tablet Take 10 mg by mouth daily.     clobetasol cream (TEMOVATE) 0.05 % Apply 1 application. topically 2 (two) times daily as needed. To labia sparingly and only when needed 15 g 0   diclofenac Sodium (VOLTAREN) 1 % GEL Apply 2 g topically as needed.     gabapentin (NEURONTIN) 100 MG capsule Take 300 mg by mouth 4 (four) times daily.     glipiZIDE (GLUCOTROL XL) 2.5 MG 24 hr tablet Take 2.5 mg by mouth every morning.     lidocaine 4 % Place 1 patch onto the skin as needed.     mesalamine (CANASA) 1000 MG suppository Place 1,000 mg rectally at bedtime as needed.     NON FORMULARY Delta 8 gummies  OZEMPIC, 1 MG/DOSE, 4 MG/3ML SOPN Inject 1 mg into the skin once a week.     tiZANidine (ZANAFLEX) 4 MG capsule Take 4 mg by mouth daily.     traZODone (DESYREL) 50 MG tablet Take 50 mg by mouth at bedtime.     No current facility-administered medications for this visit.     Review of Systems Full ROS  was asked and was negative except for the information on the HPI  Physical Exam Blood pressure 135/85, pulse 91, temperature 98 F (36.7 C), height 5\' 4"  (1.626 m), weight 154 lb 12.8 oz (70.2 kg), SpO2 98 %. CONSTITUTIONAL: NAD. EYES: Pupils are equal, round,  Sclera are non-icteric. EARS, NOSE, MOUTH AND THROAT: The oral mucosa is pink and moist. Hearing is intact to voice. LYMPH NODES:  Lymph nodes in the neck are normal. RESPIRATORY:   Lungs are clear. There is normal respiratory effort, with equal breath sounds bilaterally, and without pathologic use of accessory muscles. CARDIOVASCULAR: Heart is regular without murmurs, gallops, or rubs. BREAST: There is evidence of nipple discharge upon squeezing of the left breast.  It seems to be from a single duct located approximately 9:00.  In addition to that there is a small nodule located 9:00 about 3 to 4 cm from the nipple that seems to be about 1 cm in size.  This is not well-defined.  There are no other breast lesions on the left side or the right breast.  There is no evidence of lymphadenopathy.  The left nipple is retracted GI: The abdomen is  soft, nontender, and nondistended. There are no palpable masses. There is no hepatosplenomegaly. There are normal bowel sounds in all quadrants. GU: Rectal deferred.   MUSCULOSKELETAL: Normal muscle strength and tone. No cyanosis or edema.   SKIN: Turgor is good and there are no pathologic skin lesions or ulcers. NEUROLOGIC: Motor and sensation is grossly normal. Cranial nerves are grossly intact. PSYCH:  Oriented to person, place and time. Affect is normal.  Data Reviewed  I have personally reviewed the patient's imaging, laboratory findings and medical records.    Assessment/Plan 62 year old female with single site left breast nipple discharge.  Discussed with the patient in detail about her disease process.  I do Feel a palpable lesion left breast.  Discussed about the option of central duct excision versus observation versus further imaging with an MRI.  The patient is very interested in pursuing further MRI to further potentially establish and elucidate a more definitive pathology within the breast. We will do a referral for colonoscopy she had a history of polyps and colon cancer in the family We also discussed definitive treatment for this will be excision of central duct system.  We talked about potential surgery risk benefits and  possible complications. Please note that I spent 60 minutes in this encounter including personally reviewing imaging studies, coordinating her care, placing orders, counseling the patient and performing appropriate documentation. We Will see her back after she completes her MRI     Sterling Big, MD FACS General Surgeon 08/16/2022, 3:43 PM

## 2022-08-25 ENCOUNTER — Ambulatory Visit: Admission: RE | Admit: 2022-08-25 | Payer: Medicaid Other | Source: Ambulatory Visit

## 2022-08-31 ENCOUNTER — Other Ambulatory Visit: Payer: Self-pay

## 2022-09-05 ENCOUNTER — Ambulatory Visit (INDEPENDENT_AMBULATORY_CARE_PROVIDER_SITE_OTHER): Payer: Medicare Other | Admitting: Physician Assistant

## 2022-09-05 ENCOUNTER — Encounter: Payer: Self-pay | Admitting: Physician Assistant

## 2022-09-05 VITALS — BP 164/95 | HR 86 | Temp 98.7°F | Ht 64.0 in | Wt 156.0 lb

## 2022-09-05 DIAGNOSIS — Z86010 Personal history of colonic polyps: Secondary | ICD-10-CM

## 2022-09-05 DIAGNOSIS — K625 Hemorrhage of anus and rectum: Secondary | ICD-10-CM

## 2022-09-05 DIAGNOSIS — Z800 Family history of malignant neoplasm of digestive organs: Secondary | ICD-10-CM

## 2022-09-05 DIAGNOSIS — K51311 Ulcerative (chronic) rectosigmoiditis with rectal bleeding: Secondary | ICD-10-CM

## 2022-09-05 DIAGNOSIS — K5904 Chronic idiopathic constipation: Secondary | ICD-10-CM

## 2022-09-05 MED ORDER — POLYETHYLENE GLYCOL 3350 17 GM/SCOOP PO POWD: ORAL | 3 refills | Status: AC

## 2022-09-05 MED ORDER — PEG 3350-KCL-NA BICARB-NACL 420 G PO SOLR: 4000.0000 mL | Freq: Once | ORAL | 0 refills | Status: AC

## 2022-09-05 NOTE — Progress Notes (Signed)
Molly Amy, PA-C [address]  [address]  [place], [place] [address]  Main: [phone]  Fax: [phone]   Gastroenterology Consultation  Referring Provider:     Babs Bertin, MD Primary Care Physician:  Molly Bertin, MD Primary Gastroenterologist:  Molly Amy, PA-C / Dr. Wyline Cooke  Reason for Consultation:     Rectal Bleeding        HPI:   Molly Cooke is a 62 y.o. y/o female referred for consultation & management  by Molly Bertin, MD.    Here to evaluate rectal bleeding.  Patient states she has had intermittent rectal bleeding for 6 years.  Originally diagnosed with ulcerative colitis in 2018.  She has had 2 previous colonoscopies in 2018 and 2021.  Previous patient of [facility] and she is requesting to transfer her GI care to our office.  Last colonoscopy done 05/2019 by Dr. Earlean Polka (to evaluate rectal bleeding) at [facility] showed good prep, moderately active proctosigmoid ulcerative colitis, and 2 small 4 mm polyps removed.  She was treated with Lialda 2.4 g and Canasa suppositories.  Patient states she never took the [place].  She has used Canasa suppositories which helps.    She has family history of colon cancer in her sister age 69, and brother age 3.  Patient has never had genetic testing.  She has chronic constipation.  Typically has bowel movement every 3 days.  No current treatment.  She has also had some chronic left lower quadrant pain.  Denies diarrhea or unintentional weight loss.  Last labs 09/2021 showed hemoglobin 16.2, WBC 7.8.  Alk phos slightly elevated 137, otherwise normal CMP.    Past Medical History:  Diagnosis Date   Diabetes mellitus without complication (HCC)    History of colon polyps    In 2018 and 2021.   Hyperlipidemia    Hypertension    Ulcerative proctosigmoiditis (HCC) 2018    Past Surgical History:  Procedure Laterality Date   BREAST BIOPSY Left 08/01/2022   [place] Bx, Ribbon Clip, path pending   BREAST BIOPSY Left  08/01/2022   [place] LT BREAST BX W LOC DEV 1ST LESION IMG BX SPEC US GUIDE 08/01/2022 [facility]   HIP ARTHROSCOPY W/ LABRAL REPAIR Left 2019   KNEE ARTHROSCOPY Right 1996   NECK SURGERY     OVARIAN CYST REMOVAL Right 2000   SHOULDER ARTHROSCOPY WITH BICEPSTENOTOMY Left 2020   SHOULDER ARTHROSCOPY WITH BICEPSTENOTOMY Left 2021   TUBAL LIGATION  1985    Prior to Admission medications   Medication Sig Start Date End Date Taking? Authorizing Provider  ACCU-CHEK GUIDE test strip 1 each. 08/15/22  Yes [provider]  Accu-Chek Softclix Lancets lancets 1 each daily. 08/11/22  Yes [provider]  acetaminophen-codeine (TYLENOL #3) 300-30 MG tablet Take 1 tablet by mouth daily as needed. 07/29/22  Yes [provider]  atorvastatin (LIPITOR) 10 MG tablet Take 40 mg by mouth at bedtime. 10/30/20  Yes [provider]  benazepril (LOTENSIN) 10 MG tablet Take 10 mg by mouth daily.   Yes [provider]  clobetasol cream (TEMOVATE) 0.05 % Apply 1 application. topically 2 (two) times daily as needed. To labia sparingly and only when needed 06/14/21  Yes Doreene Burke, CNM  diclofenac Sodium (VOLTAREN) 1 % GEL Apply 2 g topically as needed. 12/22/20  Yes [provider]  glipiZIDE (GLUCOTROL XL) 2.5 MG 24 hr tablet Take 2.5 mg by mouth every morning. 10/28/20  Yes [provider]  lidocaine 4 % Place 1 patch onto the skin as needed. 10/11/21  Yes [provider]  mesalamine (CANASA) 1000 MG suppository Place 1,000 mg rectally at bedtime as needed. 05/28/19  Yes [provider]  moxifloxacin (VIGAMOX) 0.5 % ophthalmic solution Place 1 drop into the left eye. 08/27/22  Yes [provider]  NON FORMULARY Delta 8 gummies   Yes [provider]  OZEMPIC, 1 MG/DOSE, 4 MG/3ML SOPN Inject 1 mg into the skin once a week. 11/30/20  Yes [provider]  tiZANidine (ZANAFLEX) 4 MG capsule Take 4 mg by mouth daily.   Yes  [provider]  traZODone (DESYREL) 50 MG tablet Take 50 mg by mouth at bedtime. 11/22/21  Yes [provider]    Family History  Problem Relation Age of Onset   Dementia Mother      Social History   Tobacco Use   Smoking status: Former    Types: Cigarettes    Quit date: 2008    Years since quitting: 16.4    Passive exposure: Past   Smokeless tobacco: Never  Vaping Use   Vaping Use: Never used  Substance Use Topics   Alcohol use: Yes    Alcohol/week: 2.0 standard drinks of alcohol    Types: 2 Standard drinks or equivalent per week   Drug use: Never    Allergies as of 09/05/2022 - Review Complete 09/05/2022  Allergen Reaction Noted   Hydrocodone-acetaminophen Shortness Of Breath 07/06/2017   Nsaids Other (See Comments) 06/11/2019   Oxycodone-acetaminophen Itching and Other (See Comments) 06/25/2014   Clemastine Other (See Comments) 02/01/2018   Oxycodone hcl Itching 07/28/2021   Tramadol Itching 07/28/2021    Review of Systems:    All systems reviewed and negative except where noted in HPI.   Physical Exam:  BP (!) 164/95   Pulse 86   Temp 98.7 F (37.1 C)   Ht 5\' 4"  (1.626 m)   Wt 156 lb (70.8 kg)   BMI 26.78 kg/m  No LMP recorded. Patient has had an ablation. Psych:  Alert and cooperative. Normal Cooke and affect. General:   Alert,  Well-developed, well-nourished, pleasant and cooperative in NAD Head:  Normocephalic and atraumatic. Eyes:  Sclera clear, no icterus.   Conjunctiva pink. Neck:  Supple; no masses or thyromegaly. Lungs:  Respirations even and unlabored.  Clear throughout to auscultation.   No wheezes, crackles, or rhonchi. No acute distress. Heart:  Regular rate and rhythm; no murmurs, clicks, rubs, or gallops. Abdomen:  Normal bowel sounds.  No bruits.  Soft, and non-distended without masses, hepatosplenomegaly or hernias noted.  No Tenderness.  No guarding or rebound tenderness.    Neurologic:  Alert and oriented x3;  grossly  normal neurologically. Psych:  Alert and cooperative. Normal Cooke and affect.  Imaging Studies: No results found.  Assessment and Plan:   Molly Cooke is a 62 y.o. y/o female has been referred for chronic intermittent rectal bleeding for over 6 years due to ulcerative proctosigmoiditis originally diagnosed on colonoscopy in 2018.  She is reluctant to take oral mesalamine.  Has used Canasa suppositories with some benefit.  She has a strong family history of brother and sister with colon cancer at young ages.  Also personal history of colon polyps.  Last colonoscopy 05/2019.  I am scheduling an updated repeat colonoscopy.  We discussed treatment for ulcerative proctosigmoiditis at length.  I also recommend treatment for chronic constipation.  Rectal Bleeding  Scheduling Colonoscopy  I discussed risks of colonoscopy with patient to include risk of bleeding, colon perforation, and risk of sedation.  Patient expressed understanding and agrees to proceed with colonoscopy.   Ulcerative proctosigmoiditis  Treatment was discussed.  Patient education was given.  Labs:  CBC, CMP, CRP  Chronic constipation  Start MiraLAX, mix 1 capful in a drink once daily.  Increase high-fiber diet and 64 ounces of water daily.  History of colon polyps  Family history of colon cancer   Follow up in 4 weeks after colonoscopy.  Molly Amy, PA-C

## 2022-09-06 ENCOUNTER — Telehealth: Payer: Self-pay

## 2022-09-06 ENCOUNTER — Other Ambulatory Visit: Payer: Self-pay | Admitting: Obstetrics

## 2022-09-06 ENCOUNTER — Encounter: Payer: Self-pay | Admitting: Surgery

## 2022-09-06 ENCOUNTER — Ambulatory Visit (INDEPENDENT_AMBULATORY_CARE_PROVIDER_SITE_OTHER): Payer: Medicare Other | Admitting: Surgery

## 2022-09-06 VITALS — BP 120/81 | HR 93 | Temp 98.1°F | Ht 62.25 in | Wt 154.8 lb

## 2022-09-06 DIAGNOSIS — Z13820 Encounter for screening for osteoporosis: Secondary | ICD-10-CM

## 2022-09-06 DIAGNOSIS — R109 Unspecified abdominal pain: Secondary | ICD-10-CM | POA: Diagnosis not present

## 2022-09-06 DIAGNOSIS — N6452 Nipple discharge: Secondary | ICD-10-CM | POA: Diagnosis not present

## 2022-09-06 LAB — CBC
Hematocrit: 48.8 % — ABNORMAL HIGH (ref 34.0–46.6)
Hemoglobin: 15.7 g/dL (ref 11.1–15.9)
MCH: 29.1 pg (ref 26.6–33.0)
MCHC: 32.2 g/dL (ref 31.5–35.7)
MCV: 91 fL (ref 79–97)
Platelets: 173 10*3/uL (ref 150–450)
RBC: 5.39 x10E6/uL — ABNORMAL HIGH (ref 3.77–5.28)
RDW: 12.7 % (ref 11.7–15.4)
WBC: 6 10*3/uL (ref 3.4–10.8)

## 2022-09-06 LAB — COMPREHENSIVE METABOLIC PANEL
ALT: 16 IU/L (ref 0–32)
AST: 17 IU/L (ref 0–40)
Albumin/Globulin Ratio: 2
Albumin: 4.2 g/dL (ref 3.9–4.9)
Alkaline Phosphatase: 98 IU/L (ref 44–121)
BUN/Creatinine Ratio: 28 (ref 12–28)
BUN: 23 mg/dL (ref 8–27)
Bilirubin Total: 0.3 mg/dL (ref 0.0–1.2)
CO2: 24 mmol/L (ref 20–29)
Calcium: 9.1 mg/dL (ref 8.7–10.3)
Chloride: 103 mmol/L (ref 96–106)
Creatinine, Ser: 0.83 mg/dL (ref 0.57–1.00)
Globulin, Total: 2.1 g/dL (ref 1.5–4.5)
Glucose: 147 mg/dL — ABNORMAL HIGH (ref 70–99)
Potassium: 4.2 mmol/L (ref 3.5–5.2)
Sodium: 142 mmol/L (ref 134–144)
Total Protein: 6.3 g/dL (ref 6.0–8.5)
eGFR: 80 mL/min/{1.73_m2} (ref >59)

## 2022-09-06 LAB — C-REACTIVE PROTEIN: CRP: 2 mg/L (ref 0–10)

## 2022-09-06 NOTE — Progress Notes (Signed)
Outpatient Surgical Follow Up  09/06/2022  Margy Willitts is an 62 y.o. female.   Chief Complaint  Patient presents with   Follow-up    Bloody nipple discharge    HPI:  Elsee Berghorst is a 62 y.o. female in consultation for left nipple discharge close to 2 years.  She reports that the nipple is also retracted and DC is spontaneous , it is a serous discharge.  She reports no breast pain no palpable masses. SHe sees also at the pain clinic. Jae Dire did not approve MRI.  She has persistent drainage SHe Did have a mammogram and ultrasound that I have personally reviewed showing an ultrasound hypoechoic mass within the nipple.  This last mass was biopsied PATHOLOGY revealed: DIAGNOSIS: A. 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 NEGATIVE FOR ATYPICAL PROLIFERATIVE BREAST DISEASE She is diabetic and hypertensive.  SHe is a former smoker.  Endorses persistent left lower quadrant pain as well as some intermittent blood in his stool. SHe does have a pending colonoscopy  Past Medical History:  Diagnosis Date   Diabetes mellitus without complication (HCC)    History of colon polyps    In 2018 and 2021.   Hyperlipidemia    Hypertension    Ulcerative proctosigmoiditis (HCC) 2018    Past Surgical History:  Procedure Laterality Date   BREAST BIOPSY Left 08/01/2022   [place] Bx, Ribbon Clip, path pending   BREAST BIOPSY Left 08/01/2022   [place] LT BREAST BX W LOC DEV 1ST LESION IMG BX SPEC US GUIDE 08/01/2022 [facility]   HIP ARTHROSCOPY W/ LABRAL REPAIR Left 2019   KNEE ARTHROSCOPY Right 1996   NECK SURGERY     OVARIAN CYST REMOVAL Right 2000   SHOULDER ARTHROSCOPY WITH BICEPSTENOTOMY Left 2020   SHOULDER ARTHROSCOPY WITH BICEPSTENOTOMY Left 2021   TUBAL LIGATION  1985    Family History  Problem Relation Age of Onset   Dementia Mother     Social History:  reports that she  quit smoking about 16 years ago. Her smoking use included cigarettes. She has been exposed to tobacco smoke. She has never used smokeless tobacco. She reports current alcohol use of about 2.0 standard drinks of alcohol per week. She reports that she does not use drugs.  Allergies:  Allergies  Allergen Reactions   Hydrocodone-Acetaminophen Shortness Of Breath   Nsaids Other (See Comments)    GI bleed and ulcers   Oxycodone-Acetaminophen Itching and Other (See Comments)   Clemastine Other (See Comments)    Chest pain    Oxycodone Hcl Itching   Tramadol Itching    Medications reviewed.    ROS Full ROS performed and is otherwise negative other than what is stated in HPI   Physical Exam CONSTITUTIONAL: NAD. EYES: Pupils are equal, round,  Sclera are non-icteric. EARS, NOSE, MOUTH AND THROAT: The oral mucosa is pink and moist. Hearing is intact to voice. LYMPH NODES:  Lymph nodes in the neck are normal. RESPIRATORY:  Lungs are clear. There is normal respiratory effort, with equal breath sounds bilaterally, and without pathologic use of accessory muscles. CARDIOVASCULAR: Heart is regular without murmurs, gallops, or rubs. BREAST: There is evidence of nipple discharge upon squeezing of the left breast.  It seems to be from a single duct located approximately 9:00.  In addition to that there is a small nodule located 9:00 about 3 to 4 cm from the nipple that seems to  be about 1 cm in size.  This is not well-defined.  There are no other breast lesions on the left side or the right breast.  There is no evidence of lymphadenopathy.  The left nipple is retracted Right breast w dense tissue and fibrocystic changes w/o definitive masses. GI: The abdomen is  soft, nontender, and nondistended. There are no palpable masses. There is no hepatosplenomegaly. There are normal bowel sounds in all quadrants. GU: Rectal deferred.   MUSCULOSKELETAL: Normal muscle strength and tone. No cyanosis or edema.    SKIN: Turgor is good and there are no pathologic skin lesions or ulcers. NEUROLOGIC: Motor and sensation is grossly normal. Cranial nerves are grossly intact. PSYCH:  Oriented to person, place and time. Affect is normal.   Results for orders placed or performed in visit on 09/05/22 (from the past 48 hour(s))  CBC     Status: Abnormal   Collection Time: 09/05/22  8:39 AM  Result Value Ref Range   WBC 6.0 3.4 - 10.8 x10E3/uL   RBC 5.39 (H) 3.77 - 5.28 x10E6/uL   Hemoglobin 15.7 11.1 - 15.9 g/dL   Hematocrit 16.1 (H) 09.6 - 46.6 %   MCV 91 79 - 97 fL   MCH 29.1 26.6 - 33.0 pg   MCHC 32.2 31.5 - 35.7 g/dL   RDW 04.5 40.9 - 81.1 %   Platelets 173 150 - 450 x10E3/uL  C-reactive protein     Status: None   Collection Time: 09/05/22  8:39 AM  Result Value Ref Range   CRP 2 0 - 10 mg/L  Comprehensive metabolic panel     Status: Abnormal   Collection Time: 09/05/22  8:39 AM  Result Value Ref Range   Glucose 147 (H) 70 - 99 mg/dL   BUN 23 8 - 27 mg/dL   Creatinine, Ser 9.14 0.57 - 1.00 mg/dL   eGFR 80 >78 GN/FAO/1.30   BUN/Creatinine Ratio 28 12 - 28   Sodium 142 134 - 144 mmol/L   Potassium 4.2 3.5 - 5.2 mmol/L   Chloride 103 96 - 106 mmol/L   CO2 24 20 - 29 mmol/L   Calcium 9.1 8.7 - 10.3 mg/dL   Total Protein 6.3 6.0 - 8.5 g/dL   Albumin 4.2 3.9 - 4.9 g/dL   Globulin, Total 2.1 1.5 - 4.5 g/dL   Albumin/Globulin Ratio 2.0    Bilirubin Total 0.3 0.0 - 1.2 mg/dL   Alkaline Phosphatase 98 44 - 121 IU/L   AST 17 0 - 40 IU/L   ALT 16 0 - 32 IU/L   No results found.  Assessment/Plan: 62 year old female with abnormal left breast discharge.  Discussed with the patient in detail about options and she is interested in surgical intervention.  Discussed with her about left central duct excision.  Discussed with her in detail about procedure;  Risk, benefits and possible complications including but not limited to: Bleeding, infection, breast deformity, seroma and the possibility of her  pathology being upgraded.   On the right side I only feel normal fibrocystic changes related to dense breast tissue. She still endorses left lower quadrant pain and differentials will include diverticulitis versus colitis.  Will go ahead and obtain a CT scan of the abdomen pelvis with contrast and it wait on colonoscopy.  She wishes to have her surgery in about 6 weeks.  I will see her back in about 1 month and also check with her regarding her abdominal pain. I spent 40 min in this  encounter including coordination of are, placing order and performing documentation    Sterling Big, MD [facility] General Surgeon

## 2022-09-06 NOTE — Telephone Encounter (Signed)
Patient notified.   Notify patient CBC shows normal white count and hemoglobin.  No anemia.  Glucose mildly elevated at 147.  Normal kidney and liver test.  CRP is normal, no evidence of inflammation.  Continue with current plan for colonoscopy as scheduled.  Continue diabetic diet, diabetic medications and follow-up with PCP for diabetes.

## 2022-09-06 NOTE — Progress Notes (Signed)
Notify patient CBC shows normal white count and hemoglobin.  No anemia.  Glucose mildly elevated at 147.  Normal kidney and liver test.  CRP is normal, no evidence of inflammation.  Continue with current plan for colonoscopy as scheduled.  Continue diabetic diet, diabetic medications and follow-up with PCP for diabetes.

## 2022-09-06 NOTE — Telephone Encounter (Signed)
Patient states she was seen in April and was to have a Bone Density scheduled. She hasn't heard anything about scheduling. Upon chart review. No orders seen for Bone Density. Advised will  send to Paula Compton for review/order

## 2022-09-06 NOTE — Patient Instructions (Addendum)
Our surgery scheduler Britta Mccreedy will call you within 24-48 hours to get you scheduled. If you have not heard from her after 48 hours, please call our office. Have the blue sheet available when she calls to write down important information.  Your CT is scheduled for 09/13/2022 11 am (arrive by 9 am to drink the contrast) at [facility] on [address]. Nothing to eat or drink 4 hours prior.   Centralized scheduling: [phone]  If you have any concerns or questions, please feel free to call our office.   Fibrocystic Breast Changes  Fibrocystic breast changes are changes that can make your breasts swollen, lumpy, or painful. These changes happen when there is scar-like tissue (fibrous tissue) in the breasts or when tiny sacs of fluid (cysts) form in the breast. This is a common condition. It does not mean that you have cancer. It usually happens because of hormone changes during a monthly period. What are the causes? The exact cause of this condition is not known. However, you are more likely to have it: If you have high levels of female hormones. If your mother had the same condition (inherited). What are the signs or symptoms? Tenderness, swelling, mild discomfort, or pain. Rope-like tissue that can be felt when touching the breast. Lumps in one or both breasts. Changes in breast size. Breasts may get larger before your monthly period and smaller after your period. Discharge from the nipple. Symptoms are usually worse before a monthly period starts. They often get better toward the end of periods. How is this treated? Often, treatment is not needed for this condition. In some cases, you may need treatment, including: Taking medicines. Avoiding caffeine. Reducing the amount of sugar and fat in your diet. You may also have: A procedure to remove fluid from a cyst. Surgery to remove a cyst that is large or tender or does not go away. Medicines that may lower female hormones in  the body. Follow these instructions at home: Self-care Check your breasts after every monthly period. If you do not have monthly periods, check your breasts on the first day of every month. Check for: Soreness. New swelling or new lumps. A change in breast size. A change in a lump that was already there. General instructions Take over-the-counter and prescription medicines only as told by your doctor. Wear a support or sports bra that fits well. Wear this support especially when you are exercising. If told by your doctor, avoid or have less caffeine, fat, and sugar in what you eat and drink. Keep all follow-up visits. Contact a doctor if: You have fluid coming from your nipple, especially if the fluid has blood in it. You have new lumps or bumps in your breast. Your breast gets swollen, red, and painful. You have changes in how your breast looks. Your nipple looks flat or it sinks into your breast. Get help right away if: Your breast turns red, and the redness is spreading. Summary Fibrocystic breast changes are changes that can make your breasts swollen, lumpy, or painful. This condition can happen when you have hormone changes during your monthly period. Check your breasts after every monthly period. If you do not have monthly periods, check your breasts on the first day of every month. This information is not intended to replace advice given to you by your health care provider. Make sure you discuss any questions you have with your health care provider. Document Revised: 05/03/2021 Document Reviewed: 05/03/2021 Elsevier Patient Education  2024 [employer]  Inc.

## 2022-09-06 NOTE — Progress Notes (Signed)
Order placed for DEXA screening for this patient.  [name], CNM  09/06/2022 4:56 PM

## 2022-09-07 ENCOUNTER — Telehealth: Payer: Self-pay | Admitting: Surgery

## 2022-09-07 NOTE — Telephone Encounter (Signed)
Patient calls back, she is informed of all dates regarding surgery.   

## 2022-09-07 NOTE — Telephone Encounter (Signed)
Left message for patient to call, please inform her of the following regarding scheduled surgery with Dr. Everlene Farrier.   Pre-Admission date/time, and Surgery date at [facility].  Surgery Date: 10/12/22 Preadmission Testing Date: 10/03/22 (phone 8a-1p)  Also patient will need to call at [phone], between 1-3:00pm the day before surgery, to find out what time to arrive for surgery.

## 2022-09-08 NOTE — Telephone Encounter (Signed)
Patient aware. Advised to contact centralized scheduling [phone] to schedule appointment.

## 2022-09-11 ENCOUNTER — Ambulatory Visit
Admission: RE | Admit: 2022-09-11 | Discharge: 2022-09-11 | Disposition: A | Discharge status: Home / Self Care | Payer: Medicare Other | Source: Ambulatory Visit | Attending: Obstetrics | Admitting: Obstetrics

## 2022-09-11 DIAGNOSIS — Z13820 Encounter for screening for osteoporosis: Secondary | ICD-10-CM | POA: Diagnosis not present

## 2022-09-11 DIAGNOSIS — Z780 Asymptomatic menopausal state: Secondary | ICD-10-CM | POA: Insufficient documentation

## 2022-09-11 DIAGNOSIS — E119 Type 2 diabetes mellitus without complications: Secondary | ICD-10-CM | POA: Insufficient documentation

## 2022-09-11 DIAGNOSIS — M8589 Other specified disorders of bone density and structure, multiple sites: Secondary | ICD-10-CM | POA: Diagnosis not present

## 2022-09-13 ENCOUNTER — Ambulatory Visit
Admission: RE | Admit: 2022-09-13 | Discharge: 2022-09-13 | Disposition: A | Discharge status: Home / Self Care | Payer: Medicare Other | Source: Ambulatory Visit | Attending: Surgery | Admitting: Surgery

## 2022-09-13 ENCOUNTER — Other Ambulatory Visit: Payer: Medicaid Other

## 2022-09-13 DIAGNOSIS — R109 Unspecified abdominal pain: Secondary | ICD-10-CM | POA: Insufficient documentation

## 2022-09-13 MED ORDER — IOHEXOL 300 MG/ML  SOLN
100.0000 mL | Freq: Once | INTRAMUSCULAR | Status: AC | PRN
  Administered 2022-09-13: 100 mL via INTRAVENOUS

## 2022-09-13 MED ORDER — IOHEXOL 9 MG/ML PO SOLN
500.0000 mL | ORAL | Status: AC
  Administered 2022-09-13 (×2): 500 mL via ORAL

## 2022-09-19 ENCOUNTER — Other Ambulatory Visit: Payer: Self-pay

## 2022-09-19 DIAGNOSIS — K769 Liver disease, unspecified: Secondary | ICD-10-CM

## 2022-09-20 ENCOUNTER — Ambulatory Visit: Payer: Medicare Other | Admitting: Surgery

## 2022-09-25 ENCOUNTER — Ambulatory Visit
Admission: RE | Admit: 2022-09-25 | Discharge: 2022-09-25 | Disposition: A | Discharge status: Home / Self Care | Payer: Medicare HMO | Source: Ambulatory Visit | Attending: Surgery | Admitting: Surgery

## 2022-09-25 DIAGNOSIS — K769 Liver disease, unspecified: Secondary | ICD-10-CM | POA: Insufficient documentation

## 2022-09-25 DIAGNOSIS — N6452 Nipple discharge: Secondary | ICD-10-CM

## 2022-09-25 HISTORY — DX: Nipple discharge: N64.52

## 2022-09-27 ENCOUNTER — Other Ambulatory Visit: Payer: Self-pay

## 2022-09-27 DIAGNOSIS — K769 Liver disease, unspecified: Secondary | ICD-10-CM

## 2022-10-03 ENCOUNTER — Other Ambulatory Visit: Payer: Medicare Other

## 2022-10-03 ENCOUNTER — Inpatient Hospital Stay: Admission: RE | Admit: 2022-10-03 | Payer: Medicaid Other | Source: Ambulatory Visit

## 2022-10-03 ENCOUNTER — Telehealth: Payer: Self-pay | Admitting: *Deleted

## 2022-10-03 ENCOUNTER — Encounter: Payer: Self-pay | Admitting: Urgent Care

## 2022-10-03 ENCOUNTER — Encounter
Admission: RE | Admit: 2022-10-03 | Discharge: 2022-10-03 | Disposition: A | Discharge status: Home / Self Care | Payer: Medicare HMO | Source: Ambulatory Visit | Attending: Surgery | Admitting: Surgery

## 2022-10-03 VITALS — Ht 62.25 in | Wt 155.0 lb

## 2022-10-03 DIAGNOSIS — E119 Type 2 diabetes mellitus without complications: Secondary | ICD-10-CM

## 2022-10-03 DIAGNOSIS — Z01812 Encounter for preprocedural laboratory examination: Secondary | ICD-10-CM

## 2022-10-03 DIAGNOSIS — I10 Essential (primary) hypertension: Secondary | ICD-10-CM

## 2022-10-03 HISTORY — DX: Radiculopathy, cervical region: M54.12

## 2022-10-03 HISTORY — DX: Spondylosis without myelopathy or radiculopathy, lumbar region: M47.816

## 2022-10-03 HISTORY — DX: Unspecified ovarian cyst, right side: N83.201

## 2022-10-03 HISTORY — DX: Spondylosis without myelopathy or radiculopathy, thoracic region: M47.814

## 2022-10-03 HISTORY — DX: Gastro-esophageal reflux disease without esophagitis: K21.9

## 2022-10-03 HISTORY — DX: Essential (primary) hypertension: I10

## 2022-10-03 HISTORY — DX: Syphilis, unspecified: A53.9

## 2022-10-03 HISTORY — DX: Type 2 diabetes mellitus without complications: E11.9

## 2022-10-03 HISTORY — DX: Deviated nasal septum: J34.2

## 2022-10-03 NOTE — Telephone Encounter (Signed)
Patient called returned the call, I advised her that the call was for a reschedule for her colonoscopy I advised her that he will not be available.

## 2022-10-03 NOTE — Telephone Encounter (Signed)
Message left for patient to return my call. Need to reschedule due to Dr Tobi Bastos will be out of the office.

## 2022-10-03 NOTE — Telephone Encounter (Signed)
Spoken to patient. Requesting to change to 11/20/2022.  Patient already pick up the prep solution.  Will send new instructions.

## 2022-10-03 NOTE — Patient Instructions (Addendum)
Your procedure is scheduled on: Thursday, July 18 Report to the Registration Desk on the 1st floor of the [facility] To find out your arrival time, please call [phone] between 1PM - 3PM on: Wednesday, July 17 If your arrival time is 6:00 am, do not arrive before that time as the [facility] entrance doors do not open until 6:00 am.  REMEMBER: Instructions that are not followed completely may result in serious medical risk, up to and including death; or upon the discretion of your surgeon and anesthesiologist your surgery may need to be rescheduled.  Do not eat food after midnight the night before surgery.  No gum chewing or hard candies.  You may however, drink water up to 2 hours before you are scheduled to arrive for your surgery. Do not drink anything within 2 hours of your scheduled arrival time.  One week prior to surgery: starting July 11 Stop Anti-inflammatories (NSAIDS) such as Advil, Aleve, Ibuprofen, Motrin, Naproxen, Naprosyn and Aspirin based products such as Excedrin, Goody's Powder, BC Powder. Stop ANY OVER THE COUNTER supplements until after surgery. You may however, continue to take Tylenol if needed for pain up until the day of surgery.  Continue taking all prescribed medications with the exception of the following:  Ozempic - hold for 7 days before surgery. Do NOT take dose on Wednesday, July 17. Resume on your regular day, Wednesday, July 24.  TAKE ONLY THESE MEDICATIONS THE MORNING OF SURGERY WITH A SIP OF WATER:  Gabapentin Tylenol #3 if needed for pain  No Alcohol for 24 hours before or after surgery.  No Smoking including e-cigarettes for 24 hours before surgery.  No chewable tobacco products for at least 6 hours before surgery.  No nicotine patches on the day of surgery.  Do not use any "recreational" drugs for at least a week (preferably 2 weeks) before your surgery.  Please be advised that the combination of cocaine and anesthesia may have  negative outcomes, up to and including death. If you test positive for cocaine, your surgery will be cancelled.  On the morning of surgery brush your teeth with toothpaste and water, you may rinse your mouth with mouthwash if you wish. Do not swallow any toothpaste or mouthwash.  Use CHG Soap as directed on instruction sheet.  Do not wear jewelry, make-up, hairpins, clips or nail polish.  Do not wear lotions, powders, or perfumes.   Do not shave body hair from the neck down 48 hours before surgery.  Contact lenses, hearing aids and dentures may not be worn into surgery.  Do not bring valuables to the hospital. [facility] is not responsible for any missing/lost belongings or valuables.   Notify your doctor if there is any change in your medical condition (cold, fever, infection).  Wear comfortable clothing (specific to your surgery type) to the hospital.  After surgery, you can help prevent lung complications by doing breathing exercises.  Take deep breaths and cough every 1-2 hours.   If you are being discharged the day of surgery, you will not be allowed to drive home. You will need a responsible individual to drive you home and stay with you for 24 hours after surgery.   If you are taking public transportation, you will need to have a responsible individual with you.  Please call the Pre-admissions Testing Dept. at [phone] if you have any questions about these instructions.  Surgery Visitation Policy:  Patients having surgery or a procedure may have two visitors.  Children under the age of 78 must have an adult with them who is not the patient.     Preparing for Surgery with CHLORHEXIDINE GLUCONATE (CHG) Soap  Chlorhexidine Gluconate (CHG) Soap  o An antiseptic cleaner that kills germs and bonds with the skin to continue killing germs even after washing  o Used for showering the night before surgery and morning of surgery  Before surgery, you can play an  important role by reducing the number of germs on your skin.  CHG (Chlorhexidine gluconate) soap is an antiseptic cleanser which kills germs and bonds with the skin to continue killing germs even after washing.  Please do not use if you have an allergy to CHG or antibacterial soaps. If your skin becomes reddened/irritated stop using the CHG.  1. Shower the NIGHT BEFORE SURGERY and the MORNING OF SURGERY with CHG soap.  2. If you choose to wash your hair, wash your hair first as usual with your normal shampoo.  3. After shampooing, rinse your hair and body thoroughly to remove the shampoo.  4. Use CHG as you would any other liquid soap. You can apply CHG directly to the skin and wash gently with a scrungie or a clean washcloth.  5. Apply the CHG soap to your body only from the neck down. Do not use on open wounds or open sores. Avoid contact with your eyes, ears, mouth, and genitals (private parts). Wash face and genitals (private parts) with your normal soap.  6. Wash thoroughly, paying special attention to the area where your surgery will be performed.  7. Thoroughly rinse your body with warm water.  8. Do not shower/wash with your normal soap after using and rinsing off the CHG soap.  9. Pat yourself dry with a clean towel.  10. Wear clean pajamas to bed the night before surgery.  12. Place clean sheets on your bed the night of your first shower and do not sleep with pets.  13. Shower again with the CHG soap on the day of surgery prior to arriving at the hospital.  14. Do not apply any deodorants/lotions/powders.  15. Please wear clean clothes to the hospital.

## 2022-10-03 NOTE — Progress Notes (Signed)
  Perioperative Services Pre-Admission/Anesthesia Testing    Date: 10/03/22  Name: Molly Cooke MRN:   [id number]  Re: GLP-1 clearance and provider recommendations   Planned Surgical Procedure(s):    Case: [id number] Date/Time: 10/12/22 0715   Procedure: EXCISION DUCTAL SYSTEM BREAST, central, RNFA to assist (Left)   Anesthesia type: General   Pre-op diagnosis: abnormal nipple discharge   Location: [facility] OR ROOM 06 / [facility]   Surgeons: Leafy Ro, MD      Clinical Notes:  Patient is scheduled for the above procedure with the indicated provider/surgeon. In review of her medication reconciliation it was noted that patient is on a prescribed GLP-1 medication. Per guidelines issued by the American Society of Anesthesiologists (ASA), it is recommended that these medications be held for 7 days prior to the patient undergoing any type of elective surgical procedure. The patient is taking the following GLP-1 medication:  [x]  SEMAGLUTIDE   []  EXENATIDE  []  LIRAGLUTIDE   []  LIXISENATIDE  []  DULAGLUTIDE     []  TIRZEPATIDE (GLP-1/GIP)  Reached out to prescribing provider Bunnie Philips, MD) to make them aware of the guidelines from anesthesia. Given that this patient takes the prescribed GLP-1 medication for her  diabetes diagnosis, rather than for weight loss, recommendations from the prescribing provider were solicited. Prescribing provider made aware of the following so that informed decision/POC can be developed for this patient that may be taking medications belonging to these drug classes:  Oral GLP-1 medications will be held 1 day prior to surgery.  Injectable GLP-1 medications will be held 7 days prior to surgery.  Metformin is routinely held 48 hours prior to surgery due to renal concerns, potential need for contrasted imaging perioperatively, and the potential for tissue hypoxia leading to drug induced lactic acidosis.  All SGLT2i medications are held 72  hours prior to surgery as they can be associated with the increased potential for developing euglycemic diabetic ketoacidosis (EDKA).   Impression and Plan:  Molly Cooke is on a prescribed GLP-1 medication, which induces the known side effect of decreased gastric emptying. Efforts are bring made to mitigate the risk of perioperative hyperglycemic events, as elevated blood glucose levels have been found to contribute to intra/postoperative complications. Additionally, hyperglycemic extremes can potentially necessitate the postponing of a patient's elective case in order to better optimize perioperative glycemic control, again with the aforementioned guidelines in place. With this in mind, recommendations have been sought from the prescribing provider, who has cleared patient to proceed with holding the prescribed GLP-1 as per the guidelines from the ASA.   Provider recommending: no further recommendations received from the prescribing provider.  Copy of signed clearance and recommendations placed on patient's chart for inclusion in their medical record and for review by the surgical/anesthetic team on the day of her procedure.   Quentin Mulling, MSN, APRN, FNP-C, CEN [facility]  Peri-operative Services Nurse Practitioner Phone: [phone] 10/03/22 4:09 PM  NOTE: This note has been prepared using Dragon dictation software. Despite my best ability to proofread, there is always the potential that unintentional transcriptional errors may still occur from this process.

## 2022-10-06 ENCOUNTER — Encounter
Admission: RE | Admit: 2022-10-06 | Discharge: 2022-10-06 | Disposition: A | Discharge status: Home / Self Care | Payer: Medicare HMO | Source: Ambulatory Visit | Attending: Surgery | Admitting: Surgery

## 2022-10-06 ENCOUNTER — Ambulatory Visit
Admission: RE | Admit: 2022-10-06 | Discharge: 2022-10-06 | Disposition: A | Discharge status: Home / Self Care | Payer: Medicare HMO | Source: Ambulatory Visit | Attending: Surgery | Admitting: Surgery

## 2022-10-06 DIAGNOSIS — Z01812 Encounter for preprocedural laboratory examination: Secondary | ICD-10-CM

## 2022-10-06 DIAGNOSIS — K769 Liver disease, unspecified: Secondary | ICD-10-CM | POA: Insufficient documentation

## 2022-10-06 DIAGNOSIS — Z01810 Encounter for preprocedural cardiovascular examination: Secondary | ICD-10-CM | POA: Insufficient documentation

## 2022-10-06 DIAGNOSIS — E119 Type 2 diabetes mellitus without complications: Secondary | ICD-10-CM | POA: Insufficient documentation

## 2022-10-06 DIAGNOSIS — I10 Essential (primary) hypertension: Secondary | ICD-10-CM | POA: Insufficient documentation

## 2022-10-06 MED ORDER — GADOBUTROL 1 MMOL/ML IV SOLN
7.0000 mL | Freq: Once | INTRAVENOUS | Status: AC | PRN
  Administered 2022-10-06: 7 mL via INTRAVENOUS

## 2022-10-09 ENCOUNTER — Encounter: Payer: Self-pay | Admitting: Surgery

## 2022-10-09 ENCOUNTER — Ambulatory Visit: Payer: Medicare HMO | Admitting: Surgery

## 2022-10-09 VITALS — BP 117/75 | HR 74 | Temp 98.0°F | Ht 62.25 in | Wt 153.0 lb

## 2022-10-09 DIAGNOSIS — N6452 Nipple discharge: Secondary | ICD-10-CM
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
## 2022-10-09 NOTE — Patient Instructions (Addendum)
You are schedule for surgery at [facility] on 10/12/22. If you have any questions between now and then you may call us.
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
## 2022-10-12 ENCOUNTER — Other Ambulatory Visit: Payer: Self-pay

## 2022-10-12 ENCOUNTER — Telehealth: Payer: Self-pay | Admitting: Surgery

## 2022-10-12 ENCOUNTER — Ambulatory Visit
Admission: RE | Admit: 2022-10-12 | Discharge: 2022-10-12 | Disposition: A | Discharge status: Home / Self Care | Payer: Medicare HMO | Attending: Surgery | Admitting: Surgery

## 2022-10-12 ENCOUNTER — Ambulatory Visit: Payer: Medicare HMO | Admitting: Urgent Care

## 2022-10-12 ENCOUNTER — Ambulatory Visit: Payer: Medicare HMO

## 2022-10-12 ENCOUNTER — Encounter: Payer: Self-pay | Admitting: Surgery

## 2022-10-12 ENCOUNTER — Encounter
Admission: RE | Disposition: A | Discharge status: Home / Self Care | Payer: Self-pay | Source: Home / Self Care | Attending: Surgery

## 2022-10-12 DIAGNOSIS — Z87891 Personal history of nicotine dependence: Secondary | ICD-10-CM | POA: Diagnosis not present

## 2022-10-12 DIAGNOSIS — N6489 Other specified disorders of breast: Secondary | ICD-10-CM | POA: Diagnosis not present

## 2022-10-12 DIAGNOSIS — E119 Type 2 diabetes mellitus without complications: Secondary | ICD-10-CM | POA: Insufficient documentation

## 2022-10-12 DIAGNOSIS — D242 Benign neoplasm of left breast: Secondary | ICD-10-CM | POA: Insufficient documentation

## 2022-10-12 DIAGNOSIS — I10 Essential (primary) hypertension: Secondary | ICD-10-CM | POA: Insufficient documentation

## 2022-10-12 DIAGNOSIS — N6452 Nipple discharge: Secondary | ICD-10-CM | POA: Diagnosis present

## 2022-10-12 DIAGNOSIS — N6082 Other benign mammary dysplasias of left breast: Secondary | ICD-10-CM | POA: Diagnosis not present

## 2022-10-12 DIAGNOSIS — Z7985 Long-term (current) use of injectable non-insulin antidiabetic drugs: Secondary | ICD-10-CM | POA: Insufficient documentation

## 2022-10-12 DIAGNOSIS — G709 Myoneural disorder, unspecified: Secondary | ICD-10-CM | POA: Insufficient documentation

## 2022-10-12 DIAGNOSIS — Z01812 Encounter for preprocedural laboratory examination: Secondary | ICD-10-CM

## 2022-10-12 HISTORY — PX: BREAST DUCTAL SYSTEM EXCISION: SHX5242

## 2022-10-12 LAB — GLUCOSE, CAPILLARY: Glucose-Capillary: 159 mg/dL — ABNORMAL HIGH (ref 70–99)

## 2022-10-12 SURGERY — EXCISION DUCTAL SYSTEM BREAST
Anesthesia: General | Site: Breast | Laterality: Left

## 2022-10-12 MED ORDER — MIDAZOLAM HCL 2 MG/2ML IJ SOLN
INTRAMUSCULAR | Status: DC | PRN
  Administered 2022-10-12: 2 mg via INTRAVENOUS

## 2022-10-12 MED ORDER — SUGAMMADEX SODIUM 200 MG/2ML IV SOLN
INTRAVENOUS | Status: DC | PRN
  Administered 2022-10-12: 200 mg via INTRAVENOUS

## 2022-10-12 MED ORDER — PHENYLEPHRINE 80 MCG/ML (10ML) SYRINGE FOR IV PUSH (FOR BLOOD PRESSURE SUPPORT)
PREFILLED_SYRINGE | INTRAVENOUS | Status: DC | PRN
  Administered 2022-10-12: 240 ug via INTRAVENOUS
  Administered 2022-10-12: 80 ug via INTRAVENOUS

## 2022-10-12 MED ORDER — ONDANSETRON HCL 4 MG/2ML IJ SOLN
INTRAMUSCULAR | Status: DC | PRN
  Administered 2022-10-12 (×2): 4 mg via INTRAVENOUS

## 2022-10-12 MED ORDER — BUPIVACAINE-EPINEPHRINE (PF) 0.25% -1:200000 IJ SOLN
INTRAMUSCULAR | Status: AC
  Filled 2022-10-12: qty 30

## 2022-10-12 MED ORDER — MIDAZOLAM HCL 2 MG/2ML IJ SOLN
INTRAMUSCULAR | Status: AC
  Filled 2022-10-12: qty 2

## 2022-10-12 MED ORDER — ORAL CARE MOUTH RINSE: 15.0000 mL | Freq: Once | OROMUCOSAL | Status: AC

## 2022-10-12 MED ORDER — FENTANYL CITRATE (PF) 100 MCG/2ML IJ SOLN
INTRAMUSCULAR | Status: AC
  Filled 2022-10-12: qty 2

## 2022-10-12 MED ORDER — ROCURONIUM BROMIDE 100 MG/10ML IV SOLN
INTRAVENOUS | Status: DC | PRN
  Administered 2022-10-12: 50 mg via INTRAVENOUS
  Administered 2022-10-12: 10 mg via INTRAVENOUS

## 2022-10-12 MED ORDER — CHLORHEXIDINE GLUCONATE CLOTH 2 % EX PADS: 6.0000 | MEDICATED_PAD | Freq: Once | CUTANEOUS | Status: DC

## 2022-10-12 MED ORDER — PHENYLEPHRINE HCL-NACL 20-0.9 MG/250ML-% IV SOLN
INTRAVENOUS | Status: DC | PRN
  Administered 2022-10-12: 25 ug/min via INTRAVENOUS

## 2022-10-12 MED ORDER — ACETAMINOPHEN-CODEINE 300-30 MG PO TABS: 1.0000 | ORAL_TABLET | ORAL | 0 refills | Status: AC | PRN

## 2022-10-12 MED ORDER — FAMOTIDINE 20 MG PO TABS
ORAL_TABLET | ORAL | Status: AC
  Filled 2022-10-12: qty 1

## 2022-10-12 MED ORDER — CEFAZOLIN SODIUM-DEXTROSE 2-4 GM/100ML-% IV SOLN
INTRAVENOUS | Status: AC
  Filled 2022-10-12: qty 100

## 2022-10-12 MED ORDER — BUPIVACAINE LIPOSOME 1.3 % IJ SUSP
INTRAMUSCULAR | Status: AC
  Filled 2022-10-12: qty 20

## 2022-10-12 MED ORDER — EPHEDRINE SULFATE (PRESSORS) 50 MG/ML IJ SOLN
INTRAMUSCULAR | Status: DC | PRN
  Administered 2022-10-12: 5 mg via INTRAVENOUS

## 2022-10-12 MED ORDER — FENTANYL CITRATE (PF) 100 MCG/2ML IJ SOLN
INTRAMUSCULAR | Status: DC | PRN
  Administered 2022-10-12 (×2): 50 ug via INTRAVENOUS

## 2022-10-12 MED ORDER — CEFAZOLIN SODIUM-DEXTROSE 2-4 GM/100ML-% IV SOLN
2.0000 g | INTRAVENOUS | Status: AC
  Administered 2022-10-12: 2 g via INTRAVENOUS

## 2022-10-12 MED ORDER — CHLORHEXIDINE GLUCONATE 0.12 % MT SOLN
OROMUCOSAL | Status: AC
  Filled 2022-10-12: qty 15

## 2022-10-12 MED ORDER — 0.9 % SODIUM CHLORIDE (POUR BTL) OPTIME
TOPICAL | Status: DC | PRN
  Administered 2022-10-12: 1000 mL

## 2022-10-12 MED ORDER — FAMOTIDINE 20 MG PO TABS
20.0000 mg | ORAL_TABLET | Freq: Once | ORAL | Status: AC
  Administered 2022-10-12: 20 mg via ORAL

## 2022-10-12 MED ORDER — LIDOCAINE HCL (CARDIAC) PF 100 MG/5ML IV SOSY
PREFILLED_SYRINGE | INTRAVENOUS | Status: DC | PRN
  Administered 2022-10-12: 80 mg via INTRAVENOUS

## 2022-10-12 MED ORDER — SODIUM CHLORIDE 0.9 % IV SOLN: INTRAVENOUS | Status: DC

## 2022-10-12 MED ORDER — GLYCOPYRROLATE 0.2 MG/ML IJ SOLN
INTRAMUSCULAR | Status: DC | PRN
  Administered 2022-10-12: .2 mg via INTRAVENOUS

## 2022-10-12 MED ORDER — CHLORHEXIDINE GLUCONATE 0.12 % MT SOLN
15.0000 mL | Freq: Once | OROMUCOSAL | Status: AC
  Administered 2022-10-12: 15 mL via OROMUCOSAL

## 2022-10-12 MED ORDER — PROPOFOL 10 MG/ML IV BOLUS
INTRAVENOUS | Status: DC | PRN
Start: 2022-10-12 — End: 2022-10-12
  Administered 2022-10-12: 150 mg via INTRAVENOUS

## 2022-10-12 MED ORDER — VASOPRESSIN 20 UNIT/ML IV SOLN
INTRAVENOUS | Status: DC | PRN
  Administered 2022-10-12: 2 [IU] via INTRAVENOUS

## 2022-10-12 MED ORDER — ACETAMINOPHEN 10 MG/ML IV SOLN
INTRAVENOUS | Status: DC | PRN
  Administered 2022-10-12: 1000 mg via INTRAVENOUS

## 2022-10-12 MED ORDER — BUPIVACAINE-EPINEPHRINE 0.25% -1:200000 IJ SOLN
INTRAMUSCULAR | Status: DC | PRN
  Administered 2022-10-12: 30 mL

## 2022-10-12 MED ORDER — DEXAMETHASONE SODIUM PHOSPHATE 10 MG/ML IJ SOLN
INTRAMUSCULAR | Status: DC | PRN
  Administered 2022-10-12: 5 mg via INTRAVENOUS

## 2022-10-12 MED ORDER — LACTATED RINGERS IV SOLN: INTRAVENOUS | Status: DC | PRN

## 2022-10-12 SURGICAL SUPPLY — 39 items
ADH SKN CLS APL DERMABOND .7 (GAUZE/BANDAGES/DRESSINGS) ×1
APL PRP FLT STRL LF DISP 70% (MISCELLANEOUS) ×1
APPLICATOR CHLORAPREP 10 TEAL (MISCELLANEOUS) ×1 IMPLANT
APPLIER CLIP 9.375 SM OPEN (CLIP)
APR CLP SM 9.3 20 MLT OPN (CLIP)
BLADE SURG 15 STRL LF DISP TIS (BLADE) ×1 IMPLANT
BLADE SURG 15 STRL SS (BLADE) ×1
CLIP APPLIE 9.375 SM OPEN (CLIP) ×1 IMPLANT
COVER PROBE FLX POLY STRL (MISCELLANEOUS) IMPLANT
DERMABOND ADVANCED .7 DNX12 (GAUZE/BANDAGES/DRESSINGS) ×1 IMPLANT
DEVICE DUBIN SPECIMEN MAMMOGRA (MISCELLANEOUS) IMPLANT
DRAPE CHEST BREAST 77X106 FENE (MISCELLANEOUS) ×1 IMPLANT
DRAPE LAPAROTOMY 77X122 PED (DRAPES) IMPLANT
DRAPE LAPAROTOMY TRNSV 106X77 (MISCELLANEOUS) ×1 IMPLANT
ELECT CAUTERY BLADE 6.4 (BLADE) ×1 IMPLANT
ELECT REM PT RETURN 9FT ADLT (ELECTROSURGICAL) ×1
ELECTRODE REM PT RTRN 9FT ADLT (ELECTROSURGICAL) ×1 IMPLANT
GAUZE 4X4 16PLY ~~LOC~~+RFID DBL (SPONGE) ×1 IMPLANT
GLOVE BIO SURGEON STRL SZ7 (GLOVE) ×1 IMPLANT
GOWN STRL REUS W/ TWL LRG LVL3 (GOWN DISPOSABLE) ×2 IMPLANT
GOWN STRL REUS W/TWL LRG LVL3 (GOWN DISPOSABLE) ×2
MANIFOLD NEPTUNE II (INSTRUMENTS) ×1 IMPLANT
MARGIN MAP 10MM (MISCELLANEOUS) IMPLANT
NDL HYPO 25X1 1.5 SAFETY (NEEDLE) ×1 IMPLANT
NEEDLE HYPO 25X1 1.5 SAFETY (NEEDLE) ×1 IMPLANT
PACK BASIN MINOR ARMC (MISCELLANEOUS) ×1 IMPLANT
SPONGE T-LAP 18X18 ~~LOC~~+RFID (SPONGE) IMPLANT
SUT MNCRL 4-0 (SUTURE) ×1
SUT MNCRL 4-0 27XMFL (SUTURE) ×1
SUT SILK 2 0 SH (SUTURE) ×1 IMPLANT
SUT VIC AB 2-0 CT2 27 (SUTURE) ×1 IMPLANT
SUT VIC AB 2-0 SH 27 (SUTURE) ×3
SUT VIC AB 2-0 SH 27XBRD (SUTURE) IMPLANT
SUT VIC AB 3-0 SH 27 (SUTURE) ×1
SUT VIC AB 3-0 SH 27X BRD (SUTURE) ×1 IMPLANT
SUTURE MNCRL 4-0 27XMF (SUTURE) ×1 IMPLANT
SYR 10ML LL (SYRINGE) ×1 IMPLANT
TRAP FLUID SMOKE EVACUATOR (MISCELLANEOUS) ×1 IMPLANT
WATER STERILE IRR 500ML POUR (IV SOLUTION) ×1 IMPLANT

## 2022-10-12 NOTE — Transfer of Care (Signed)
Immediate Anesthesia Transfer of Care Note  Patient: Molly Cooke  Procedure(s) Performed: EXCISION DUCTAL SYSTEM BREAST, central, RNFA to assist (Left: Breast)  Patient Location: PACU  Anesthesia Type:General  Level of Consciousness: awake, drowsy, and patient cooperative  Airway & Oxygen Therapy: Patient Spontanous Breathing and Patient connected to face mask oxygen  Post-op Assessment: Report given to RN and Post -op Vital signs reviewed and stable  Post vital signs: Reviewed and stable  Last Vitals:  Vitals Value Taken Time  BP 149/81 10/12/22 1105  Temp 36.2 C 10/12/22 1105  Pulse 102 10/12/22 1109  Resp 19 10/12/22 1109  SpO2 100 % 10/12/22 1109  Vitals shown include unfiled device data.  Last Pain:  Vitals:   10/12/22 0829  TempSrc: Temporal  PainSc: 4       Patients Stated Pain Goal: 2 (10/12/22 0829)  Complications: No notable events documented.

## 2022-10-12 NOTE — Op Note (Signed)
Pre-operative Diagnosis:Left nipple Discharge with additional lesion a t 9 o'clock c/w benign fibrocystic and apocrine changes    Post-operative Diagnosis: Same   Surgeon: Sterling Big,  MD FACS  Anesthesia: GETA  Procedure: Left Partial mastectomy, Complex closure with tissue rearrangements measuring 92cm2   Findings: Clip within xray specimen   Estimated Blood Loss: Minimal         Drains: None         Specimens: partial mastectomy with labels       Complications: none                 Condition: Stable   Procedure Details  The patient was seen again in the Holding Room. The benefits, complications, treatment options, and expected outcomes were discussed with the patient. The risks of bleeding, infection, recurrence of symptoms, failure to resolve symptoms, hematoma, seroma, open wound, cosmetic deformity, and the need for further surgery were discussed.  The patient was taken to Operating Room, identified  and the procedure verified.  A Time Out was held and the above information confirmed.  Prior to the induction of general anesthesia, antibiotic prophylaxis was administered. VTE prophylaxis was in place. Appropriate anesthesia was then administered and tolerated well. The chest was prepped with Chloraprep and draped in the sterile fashion. The patient was positioned in the supine position.  Lacrimal probe identified a single duct with persistent dark brown drainage. Probe went about 3 cm deeper into the breast parenchyma. Periareolar incision was created from 2 o'clock to 8 oclock . Circumferential flaps created with cautery making sure we did not go too shallow or compromise perfusion of the nipple. We made sure the retroareolar tissue was incorporated into our specimen, I used a 2-0 vicryl stitch to elevate the parenchyma, since the pt had a breat lesion at 9 o'clock I wanted to address the lesion. It was palpable but subtle. THe exicison was shifted toward the medial side to  incorporate known lesion where ribbon clip was. We extended or flaps in the standard fashion. After a good excisional margin was achieved the specimen was removed. Faxitron confirmed good circumferential margins and clip within the specimen.  Hemostasis was with electrocautery.Wound irrigated   Tissue advancement flaps were performed to decrease the volume deficit in the area of the resection.  Please note that the total void area was 10x 9.2 cm equals 92 cm.  The chest wall flaps were created by incising the breast parenchyma from the pectoralis fascia in a circumferential method.  The breast parenchyma was then reapproximated in a deep to superficial fashion using interrupted 2-0 Vicryl sutures.  Please note that I placed 2 deep layers of 2-0 Vicryl's.  Once assuring that hemostasis was adequate and checked multiple times the wound was closed with 4-0 subcuticular Monocryl sutures. Dermabond was placed  Patient was taken to the recovery room in stable condition.   Sterling Big , MD, FACS
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
## 2022-10-12 NOTE — Discharge Instructions (Addendum)
Lumpectomy, Care After The following information offers guidance on how to care for yourself after your procedure. Your health care provider may also give you more specific instructions. If you have problems or questions, contact your health care provider. What can I expect after the procedure? After the procedure, it is common to have: Some pain or redness at the incision site. Breast swelling. Breast tenderness. Stiffness in your arm or shoulder. A change in the shape and feel of your breast. Scar tissue that feels hard to the touch in the area where the lump was removed. Follow these instructions at home: Medicines Take over-the-counter and prescription medicines only as told by your health care provider. If you were prescribed an antibiotic, take it as told by your health care provider. Do not stop taking the antibiotic even if you start to feel better. Ask your health care provider if the medicine prescribed to you: Requires you to avoid driving or using machinery. Can cause constipation. You may need to take these actions to prevent or treat constipation: Drink enough fluid to keep your urine pale yellow. Take over-the-counter or prescription medicines. Eat foods that are high in fiber, such as beans, whole grains, and fresh fruits and vegetables. Limit foods that are high in fat and processed sugars, such as fried or sweet foods. Incision care     Follow instructions from your health care provider about how to take care of your incision. Make sure you: Wash your hands with soap and water for at least 20 seconds before and after you change your bandage (dressing). If soap and water are not available, use hand sanitizer. Change your dressing as told by your health care provider. Leave stitches (sutures), skin glue, or adhesive strips in place. These skin closures may need to stay in place for 2 weeks or longer. If adhesive strip edges start to loosen and curl up, you may trim the  loose edges. Do not remove adhesive strips completely unless your health care provider tells you to do that. Check your incision area every day for signs of infection. Check for: More redness, swelling, or pain. Fluid or blood. Warmth. Pus or a bad smell. Keep your dressing clean and dry. If you were sent home with a surgical drain in place, follow instructions from your health care provider about emptying it. Bathing Do not take baths, swim, or use a hot tub until your health care provider approves. Ask your health care provider if you may take showers. You may only be allowed to take sponge baths. Activity Rest as told by your health care provider. Do not sit for a long time without moving. Get up to take short walks every 1-2 hours. This will improve blood flow and breathing. Ask for help if you feel weak or unsteady. Be careful to avoid any activities that could cause an injury to your arm on the side of your surgery. Do not lift anything that is heavier than 10 lb (4.5 kg), or the limit that you are told, until your health care provider says that it is safe. Avoid lifting with the arm that is on the side of your surgery. Do not carry heavy objects on your shoulder on the side of your surgery. Do exercises to keep your shoulder and arm from getting stiff and swollen. Talk with your health care provider about which exercises are safe for you. Return to your normal activities as told by your health care provider. Ask your health care provider what activities  are safe for you. General instructions Wear a supportive bra as told by your health care provider. Raise (elevate) your arm above the level of your heart while you are sitting or lying down. Do not wear tight jewelry on your arm, wrist, or fingers on the side of your surgery. Wear compression stockings as told by your health care provider. These stockings help to prevent blood clots and reduce swelling in your legs. If you had any lymph  nodes removed during your procedure, be sure to tell all of your health care providers. It is important to share this information before you have certain procedures, such as blood tests or blood pressure measurements. Keep all follow-up visits. You may need to be screened for extra fluid around the lymph nodes and swelling in the breast and arm (lymphedema). Contact a health care provider if: You develop a rash. You have a fever. Your pain worsens or pain medicine is not working. You have swelling, weakness, or numbness in your arm that does not improve after a few weeks. You have new swelling in your breast. You have any of these signs of infection: More redness, swelling, or pain in your incision area. Fluid or blood coming from your incision. Warmth coming from the incision area. Pus or a bad smell coming from your incision. Get help right away if: You have very bad pain in your breast or arm. You have swelling in your legs or arms. You have redness, warmth, or pain in your leg or arm. You have chest pain. You have difficulty breathing. These symptoms may be an emergency. Get help right away. Call 911. Do not wait to see if the symptoms will go away. Do not drive yourself to the hospital. Summary After the procedure, it is common to have breast tenderness, swelling in your breast, and stiffness in your arm and shoulder. Follow instructions from your health care provider about how to take care of your incision. Do not lift anything that is heavier than 10 lb (4.5 kg), or the limit that you are told, until your health care provider says that it is safe. Avoid lifting with the arm that is on the side of your surgery. If you had any lymph nodes removed during your procedure, be sure to tell all of your health care providers. This information is not intended to replace advice given to you by your health care provider. Make sure you discuss any questions you have   AMBULATORY SURGERY   DISCHARGE INSTRUCTIONS   The drugs that you were given will stay in your system until tomorrow so for the next 24 hours you should not:  Drive an automobile Make any legal decisions Drink any alcoholic beverage   You may resume regular meals tomorrow.  Today it is better to start with liquids and gradually work up to solid foods.  You may eat anything you prefer, but it is better to start with liquids, then soup and crackers, and gradually work up to solid foods.   Please notify your doctor immediately if you have any unusual bleeding, trouble breathing, redness and pain at the surgery site, drainage, fever, or pain not relieved by medication.    Additional Instructions:    Please contact your physician with any problems or Same Day Surgery at [phone], Monday through Friday 6 am to 4 pm, or [place] at [facility] number at [phone].

## 2022-10-12 NOTE — Interval H&P Note (Signed)
History and Physical Interval Note:  10/12/2022 8:55 AM  Molly Cooke  has presented today for surgery, with the diagnosis of abnormal nipple discharge.  The various methods of treatment have been discussed with the patient and family. After consideration of risks, benefits and other options for treatment, the patient has consented to  Procedure(s): EXCISION DUCTAL SYSTEM BREAST, central, RNFA to assist (Left) as a surgical intervention.  The patient's history has been reviewed, patient examined, no change in status, stable for surgery.  I have reviewed the patient's chart and labs.  Questions were answered to the patient's satisfaction.     Bentzion Dauria F Nera Haworth

## 2022-10-12 NOTE — Anesthesia Postprocedure Evaluation (Signed)
Anesthesia Post Note  Patient: Molly Cooke  Procedure(s) Performed: EXCISION DUCTAL SYSTEM BREAST, central, RNFA to assist (Left: Breast)  Patient location during evaluation: PACU Anesthesia Type: General Level of consciousness: awake and alert Pain management: pain level controlled Vital Signs Assessment: post-procedure vital signs reviewed and stable Respiratory status: spontaneous breathing, nonlabored ventilation, respiratory function stable and patient connected to nasal cannula oxygen Cardiovascular status: blood pressure returned to baseline and stable Postop Assessment: no apparent nausea or vomiting Anesthetic complications: no   No notable events documented.   Last Vitals:  Vitals:   10/12/22 0829 10/12/22 1105  BP: 125/81 (!) 149/81  Pulse: 82 (!) 106  Resp: 18 18  Temp: 36.4 C (!) 36.2 C  SpO2: 96% 100%    Last Pain:  Vitals:   10/12/22 1105  TempSrc:   PainSc: Asleep                 Corinda Gubler

## 2022-10-12 NOTE — Telephone Encounter (Signed)
Error

## 2022-10-12 NOTE — Anesthesia Procedure Notes (Signed)
Procedure Name: Intubation Date/Time: 10/12/2022 9:21 AM  Performed by: Mohammed Kindle, CRNAPre-anesthesia Checklist: Patient identified, Emergency Drugs available, Suction available and Patient being monitored Patient Re-evaluated:Patient Re-evaluated prior to induction Oxygen Delivery Method: Circle system utilized Preoxygenation: Pre-oxygenation with 100% oxygen Induction Type: IV induction Ventilation: Mask ventilation without difficulty Laryngoscope Size: McGraph and 3 Grade View: Grade I Tube type: Oral Tube size: 6.5 mm Number of attempts: 1 Airway Equipment and Method: Stylet and Oral airway Placement Confirmation: ETT inserted through vocal cords under direct vision, positive ETCO2, breath sounds checked- equal and bilateral and CO2 detector Secured at: 21 cm Tube secured with: Tape Dental Injury: Teeth and Oropharynx as per pre-operative assessment

## 2022-10-13 ENCOUNTER — Encounter: Payer: Self-pay | Admitting: Surgery

## 2022-10-25 ENCOUNTER — Encounter: Payer: Self-pay | Admitting: Surgery

## 2022-10-25 ENCOUNTER — Ambulatory Visit (INDEPENDENT_AMBULATORY_CARE_PROVIDER_SITE_OTHER): Payer: Medicare HMO | Admitting: Surgery

## 2022-10-25 VITALS — BP 168/100 | HR 90 | Temp 97.9°F | Ht 63.25 in | Wt 153.4 lb

## 2022-10-25 DIAGNOSIS — N6452 Nipple discharge: Secondary | ICD-10-CM

## 2022-10-25 DIAGNOSIS — N6489 Other specified disorders of breast: Secondary | ICD-10-CM

## 2022-10-25 DIAGNOSIS — Z09 Encounter for follow-up examination after completed treatment for conditions other than malignant neoplasm: Secondary | ICD-10-CM

## 2022-10-25 NOTE — Patient Instructions (Signed)
If you have any concerns or questions, please feel free to call our office. Follow up in 3 months.   Breast Self-Awareness Breast self-awareness is knowing how your breasts look and feel. You need to: Check your breasts on a regular basis. Tell your doctor about any changes. Become familiar with the look and feel of your breasts. This can help you catch a breast problem while it is still small and can be treated. You should do breast self-exams even if you have breast implants. What you need: A mirror. A well-lit room. A pillow or other soft object. How to do a breast self-exam Follow these steps to do a breast self-exam: Look for changes  Take off all the clothes above your waist. Stand in front of a mirror in a room with good lighting. Put your hands down at your sides. Compare your breasts in the mirror. Look for any difference between them, such as: A difference in shape. A difference in size. Wrinkles, dips, and bumps in one breast and not the other. Look at each breast for changes in the skin, such as: Redness. Scaly areas. Skin that has gotten thicker. Dimpling. Open sores (ulcers). Look for changes in your nipples, such as: Fluid coming out of a nipple. Fluid around a nipple. Bleeding. Dimpling. Redness. A nipple that looks pushed in (retracted), or that has changed position. Feel for changes Lie on your back. Feel each breast. To do this: Pick a breast to feel. Place a pillow under the shoulder closest to that breast. Put the arm closest to that breast behind your head. Feel the nipple area of that breast using the hand of your other arm. Feel the area with the pads of your three middle fingers by making small circles with your fingers. Use light, medium, and firm pressure. Continue the overlapping circles, moving downward over the breast. Keep making circles with your fingers. Stop when you feel your ribs. Start making circles with your fingers again, this time  going upward until you reach your collarbone. Then, make circles outward across your breast and into your armpit area. Squeeze your nipple. Check for discharge and lumps. Repeat these steps to check your other breast. Sit or stand in the tub or shower. With soapy water on your skin, feel each breast the same way you did when you were lying down. Write down what you find Writing down what you find can help you remember what to tell your doctor. Write down: What is normal for each breast. Any changes you find in each breast. These include: The kind of changes you find. A tender or painful breast. Any lump you find. Write down its size and where it is. When you last had your monthly period (menstrual cycle). General tips If you are breastfeeding, the best time to check your breasts is after you feed your baby or after you use a breast pump. If you get monthly bleeding, the best time to check your breasts is 5-7 days after your monthly cycle ends. With time, you will become comfortable with the self-exam. You will also start to know if there are changes in your breasts. Contact a doctor if: You see a change in the shape or size of your breasts or nipples. You see a change in the skin of your breast or nipples, such as red or scaly skin. You have fluid coming from your nipples that is not normal. You find a new lump or thick area. You have breast pain. You  have any concerns about your breast health. Summary Breast self-awareness includes looking for changes in your breasts and feeling for changes within your breasts. You should do breast self-awareness in front of a mirror in a well-lit room. If you get monthly periods (menstrual cycles), the best time to check your breasts is 5-7 days after your period ends. Tell your doctor about any changes you see in your breasts. Changes include changes in size, changes on the skin, painful or tender breasts, or fluid from your nipples that is not  normal. This information is not intended to replace advice given to you by your health care provider. Make sure you discuss any questions you have with your health care provider. Document Revised: 08/18/2021 Document Reviewed: 01/13/2021 Elsevier Patient Education  2024 [employer].

## 2022-10-25 NOTE — Progress Notes (Signed)
S/p left breast lumpectomy and central duct excision Path papilloma and benign changes She sis doing well No issues  PE NAD Incision healign well, no infection or hematoma, breast is symmetric and w/o deformities  A/P Doing well RTC 3 months Path d/w pt

## 2022-10-30 ENCOUNTER — Encounter: Payer: Medicare HMO | Admitting: Surgery

## 2022-11-07 ENCOUNTER — Other Ambulatory Visit: Payer: Self-pay

## 2022-11-13 ENCOUNTER — Encounter: Payer: Self-pay | Admitting: Gastroenterology

## 2022-11-15 ENCOUNTER — Telehealth: Payer: Self-pay

## 2022-11-15 NOTE — Telephone Encounter (Signed)
Per pt she gotten the incorrect prep from, pharmacy. Her paper work has one thing listed but she was given another. Pt also have question about if she can have procedure due to have Covid

## 2022-11-15 NOTE — Telephone Encounter (Addendum)
Spoken to patient and inform her that she would have to wait 10 days after being Dx with Covid.  I have apologized to patient regarding the mix up in instructions for the prep solution. I have fixed the instructions, sent to MyChart and send to patient's email.

## 2022-11-20 ENCOUNTER — Ambulatory Visit
Admission: RE | Admit: 2022-11-20 | Discharge: 2022-11-20 | Disposition: A | Discharge status: Home / Self Care | Payer: Medicare HMO | Attending: Gastroenterology | Admitting: Gastroenterology

## 2022-11-20 ENCOUNTER — Other Ambulatory Visit: Payer: Self-pay | Admitting: *Deleted

## 2022-11-20 ENCOUNTER — Telehealth: Payer: Self-pay | Admitting: *Deleted

## 2022-11-20 ENCOUNTER — Encounter
Admission: RE | Disposition: A | Discharge status: Home / Self Care | Payer: Self-pay | Source: Home / Self Care | Attending: Gastroenterology

## 2022-11-20 DIAGNOSIS — K625 Hemorrhage of anus and rectum: Secondary | ICD-10-CM | POA: Insufficient documentation

## 2022-11-20 DIAGNOSIS — Z538 Procedure and treatment not carried out for other reasons: Secondary | ICD-10-CM | POA: Insufficient documentation

## 2022-11-20 HISTORY — PX: COLONOSCOPY WITH PROPOFOL: SHX5780

## 2022-11-20 SURGERY — COLONOSCOPY WITH PROPOFOL
Anesthesia: General

## 2022-11-20 NOTE — OR Nursing (Signed)
PT ARRIVED TO UNIT STATING ,SHE TOOK 4 DULCOLAX TABS,N/V WITH PREP BOTH TIMES EVEN THOUGH SHE DID A SPLIT  PREP, REPORTS ONLY HAD 1 BM AFTER 1ST PART OF PREP. REPORTS STOOL IS LIQUID GREEN. DR ANNA SPOKE WITH PT WHO WILL RESCHEDULE PT WITH SUTABS. MESSAGE LEFT WITH PT TO RETRIEVE TABS FROM OFFICE AND SHE WILL BE RE-SCHEDULED

## 2022-11-20 NOTE — Telephone Encounter (Signed)
Received message from endo unit and from Dr Tobi Bastos that patient was not clean out. Dr Tobi Bastos wants patient to try Sutab which patient did come by the office to pick up.  Requesting to reschedule on 12/07/2022.  New instructions will be sent.

## 2022-11-21 ENCOUNTER — Encounter: Payer: Self-pay | Admitting: Gastroenterology

## 2022-11-29 ENCOUNTER — Ambulatory Visit: Payer: Medicaid Other | Admitting: Physician Assistant

## 2022-11-30 ENCOUNTER — Encounter: Payer: Self-pay | Admitting: Gastroenterology

## 2022-12-07 ENCOUNTER — Ambulatory Visit: Payer: Medicare HMO | Admitting: Anesthesiology

## 2022-12-07 ENCOUNTER — Ambulatory Visit
Admission: RE | Admit: 2022-12-07 | Discharge: 2022-12-07 | Disposition: A | Discharge status: Home / Self Care | Payer: Medicare HMO | Attending: Gastroenterology | Admitting: Gastroenterology

## 2022-12-07 ENCOUNTER — Encounter
Admission: RE | Disposition: A | Discharge status: Home / Self Care | Payer: Self-pay | Source: Home / Self Care | Attending: Gastroenterology

## 2022-12-07 ENCOUNTER — Encounter: Payer: Self-pay | Admitting: Gastroenterology

## 2022-12-07 DIAGNOSIS — Z87891 Personal history of nicotine dependence: Secondary | ICD-10-CM | POA: Diagnosis not present

## 2022-12-07 DIAGNOSIS — K51311 Ulcerative (chronic) rectosigmoiditis with rectal bleeding: Secondary | ICD-10-CM | POA: Insufficient documentation

## 2022-12-07 DIAGNOSIS — Z7985 Long-term (current) use of injectable non-insulin antidiabetic drugs: Secondary | ICD-10-CM | POA: Diagnosis not present

## 2022-12-07 DIAGNOSIS — Z7984 Long term (current) use of oral hypoglycemic drugs: Secondary | ICD-10-CM | POA: Insufficient documentation

## 2022-12-07 DIAGNOSIS — K640 First degree hemorrhoids: Secondary | ICD-10-CM | POA: Insufficient documentation

## 2022-12-07 DIAGNOSIS — E119 Type 2 diabetes mellitus without complications: Secondary | ICD-10-CM | POA: Insufficient documentation

## 2022-12-07 DIAGNOSIS — K6289 Other specified diseases of anus and rectum: Secondary | ICD-10-CM | POA: Diagnosis not present

## 2022-12-07 DIAGNOSIS — D125 Benign neoplasm of sigmoid colon: Secondary | ICD-10-CM | POA: Diagnosis not present

## 2022-12-07 DIAGNOSIS — Z1211 Encounter for screening for malignant neoplasm of colon: Secondary | ICD-10-CM

## 2022-12-07 DIAGNOSIS — K5730 Diverticulosis of large intestine without perforation or abscess without bleeding: Secondary | ICD-10-CM | POA: Insufficient documentation

## 2022-12-07 DIAGNOSIS — K648 Other hemorrhoids: Secondary | ICD-10-CM | POA: Diagnosis not present

## 2022-12-07 DIAGNOSIS — Z8719 Personal history of other diseases of the digestive system: Secondary | ICD-10-CM | POA: Insufficient documentation

## 2022-12-07 DIAGNOSIS — D126 Benign neoplasm of colon, unspecified: Secondary | ICD-10-CM

## 2022-12-07 DIAGNOSIS — D122 Benign neoplasm of ascending colon: Secondary | ICD-10-CM | POA: Insufficient documentation

## 2022-12-07 DIAGNOSIS — I10 Essential (primary) hypertension: Secondary | ICD-10-CM | POA: Insufficient documentation

## 2022-12-07 DIAGNOSIS — K625 Hemorrhage of anus and rectum: Secondary | ICD-10-CM

## 2022-12-07 HISTORY — PX: COLONOSCOPY WITH PROPOFOL: SHX5780

## 2022-12-07 HISTORY — PX: POLYPECTOMY: SHX5525

## 2022-12-07 LAB — GLUCOSE, CAPILLARY: Glucose-Capillary: 146 mg/dL — ABNORMAL HIGH (ref 70–99)

## 2022-12-07 SURGERY — COLONOSCOPY WITH PROPOFOL
Anesthesia: General

## 2022-12-07 MED ORDER — SODIUM CHLORIDE 0.9 % IV SOLN
INTRAVENOUS | Status: DC
  Administered 2022-12-07: 1000 mL via INTRAVENOUS

## 2022-12-07 MED ORDER — DEXMEDETOMIDINE HCL IN NACL 200 MCG/50ML IV SOLN
INTRAVENOUS | Status: DC | PRN
Start: 2022-12-07 — End: 2022-12-07
  Administered 2022-12-07: 8 ug via INTRAVENOUS

## 2022-12-07 MED ORDER — LIDOCAINE HCL (CARDIAC) PF 100 MG/5ML IV SOSY
PREFILLED_SYRINGE | INTRAVENOUS | Status: DC | PRN
  Administered 2022-12-07: 40 mg via INTRAVENOUS

## 2022-12-07 MED ORDER — PROPOFOL 500 MG/50ML IV EMUL
INTRAVENOUS | Status: DC | PRN
  Administered 2022-12-07: 175 ug/kg/min via INTRAVENOUS

## 2022-12-07 MED ORDER — PROPOFOL 10 MG/ML IV BOLUS
INTRAVENOUS | Status: DC | PRN
  Administered 2022-12-07: 90 mg via INTRAVENOUS

## 2022-12-07 MED ORDER — PROPOFOL 1000 MG/100ML IV EMUL
INTRAVENOUS | Status: AC
  Filled 2022-12-07: qty 100

## 2022-12-07 NOTE — Op Note (Signed)
[facility] Gastroenterology Patient Name: Molly Cooke Procedure Date: 12/07/2022 9:15 AM MRN: [id number] Account #: [id number] Date of Birth: 1960-06-23 Admit Type: Outpatient Age: 62 Room: [facility] ENDO ROOM 4 Gender: Female Note Status: Finalized Instrument Name: Prentice Docker [id number] Procedure:             Colonoscopy Indications:           Rectal bleeding Providers:             Wyline Mood MD, MD Referring MD:          No Local Md, MD (Referring MD) Medicines:             Monitored Anesthesia Care Complications:         No immediate complications. Procedure:             Pre-Anesthesia Assessment:                        - Prior to the procedure, a History and Physical was                         performed, and patient medications, allergies and                         sensitivities were reviewed. The patient's tolerance                         of previous anesthesia was reviewed.                        - The risks and benefits of the procedure and the                         sedation options and risks were discussed with the                         patient. All questions were answered and informed                         consent was obtained.                        - ASA Grade Assessment: II - A patient with mild                         systemic disease.                        After obtaining informed consent, the colonoscope was                         passed under direct vision. Throughout the procedure,                         the patient's blood pressure, pulse, and oxygen                         saturations were monitored continuously. The                         Colonoscope was introduced through the  anus and                         advanced to the the cecum, identified by the                         appendiceal orifice. The colonoscopy was performed                         with ease. The patient tolerated the procedure well.                         The  quality of the bowel preparation was excellent.                         The ileocecal valve, appendiceal orifice, and rectum                         were photographed. Findings:      The perianal and digital rectal examinations were normal.      Two sessile polyps were found in the ascending colon. The polyps were 4       to 6 mm in size. These polyps were removed with a cold snare. Resection       and retrieval were complete.      A 5 mm polyp was found in the sigmoid colon. The polyp was sessile. The       polyp was removed with a cold snare. Resection and retrieval were       complete.      Multiple small-mouthed diverticula were found in the sigmoid colon.      Non-bleeding internal hemorrhoids were found during retroflexion. The       hemorrhoids were medium-sized and Grade I (internal hemorrhoids that do       not prolapse).      Normal mucosa was found in the rectum. Biopsies were taken with a cold       forceps for histology.      The exam was otherwise without abnormality on direct and retroflexion       views. Impression:            - Two 4 to 6 mm polyps in the ascending colon, removed                         with a cold snare. Resected and retrieved.                        - One 5 mm polyp in the sigmoid colon, removed with a                         cold snare. Resected and retrieved.                        - Diverticulosis in the sigmoid colon.                        - Non-bleeding internal hemorrhoids.                        - Normal mucosa in the rectum. Biopsied.                        -  The examination was otherwise normal on direct and                         retroflexion views. Recommendation:        - Discharge patient to home (with escort).                        - Resume previous diet.                        - Continue present medications.                        - Await pathology results.                        - Repeat colonoscopy in 3 - 5 years for surveillance                          based on pathology results.                        - Return to GI office as previously scheduled. Procedure Code(s):     --- Professional ---                        [phone], Colonoscopy, flexible; with removal of                         tumor(s), polyp(s), or other lesion(s) by snare                         technique                        45380, 59, Colonoscopy, flexible; with biopsy, single                         or multiple Diagnosis Code(s):     --- Professional ---                        D12.2, Benign neoplasm of ascending colon                        K64.0, First degree hemorrhoids                        D12.5, Benign neoplasm of sigmoid colon                        K57.30, Diverticulosis of large intestine without                         perforation or abscess without bleeding                        K62.5, Hemorrhage of anus and rectum CPT copyright 2022 American Medical Association. All rights reserved. The codes documented in this report are preliminary and upon coder review may  be revised to meet current compliance requirements. Wyline Mood, MD Wyline Mood MD, MD 12/07/2022 9:43:08 AM This report has been signed electronically.  Number of Addenda: 0 Note Initiated On: 12/07/2022 9:15 AM Scope Withdrawal Time: 0 hours 10 minutes 24 seconds  Total Procedure Duration: 0 hours 13 minutes 38 seconds  Estimated Blood Loss:  Estimated blood loss: none.      [facility]

## 2022-12-07 NOTE — Anesthesia Procedure Notes (Signed)
Date/Time: 12/07/2022 9:20 AM  Performed by: Stormy Fabian, CRNAPre-anesthesia Checklist: Patient identified, Emergency Drugs available, Suction available and Patient being monitored Patient Re-evaluated:Patient Re-evaluated prior to induction Oxygen Delivery Method: Nasal cannula Induction Type: IV induction Dental Injury: Teeth and Oropharynx as per pre-operative assessment  Comments: Nasal cannula with etCO2 monitoring
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
## 2022-12-07 NOTE — Transfer of Care (Signed)
Immediate Anesthesia Transfer of Care Note  Patient: Molly Cooke  Procedure(s) Performed: Procedure(s): COLONOSCOPY WITH PROPOFOL (N/A)  Patient Location: PACU and Endoscopy Unit  Anesthesia Type:General  Level of Consciousness: sedated  Airway & Oxygen Therapy: Patient Spontanous Breathing and Patient connected to nasal cannula oxygen  Post-op Assessment: Report given to RN and Post -op Vital signs reviewed and stable  Post vital signs: Reviewed and stable  Last Vitals:  Vitals:   12/07/22 0851 12/07/22 0943  BP: (!) 185/96 115/70  Pulse: 67 71  Resp: 18 17  Temp: (!) 35.8 C   SpO2: 100% 97%    Complications: No apparent anesthesia complications

## 2022-12-07 NOTE — Anesthesia Postprocedure Evaluation (Signed)
Anesthesia Post Note  Patient: Molly Cooke  Procedure(s) Performed: COLONOSCOPY WITH PROPOFOL  Patient location during evaluation: PACU Anesthesia Type: General Level of consciousness: awake and alert, oriented and patient cooperative Pain management: pain level controlled Vital Signs Assessment: post-procedure vital signs reviewed and stable Respiratory status: spontaneous breathing, nonlabored ventilation and respiratory function stable Cardiovascular status: blood pressure returned to baseline and stable Postop Assessment: adequate PO intake Anesthetic complications: no   No notable events documented.   Last Vitals:  Vitals:   12/07/22 0943 12/07/22 1012  BP: 115/70   Pulse: 71   Resp: 17   Temp: (!) 36.1 C (!) 35.6 C  SpO2: 97% 99%    Last Pain:  Vitals:   12/07/22 1012  TempSrc: Temporal  PainSc: 0-No pain                 Reed Breech

## 2022-12-07 NOTE — Anesthesia Preprocedure Evaluation (Addendum)
Anesthesia Evaluation  Patient identified by MRN, date of birth, ID band Patient awake    Reviewed: Allergy & Precautions, NPO status , Patient's Chart, lab work & pertinent test results  History of Anesthesia Complications Negative for: history of anesthetic complications  Airway Mallampati: I   Neck ROM: Full    Dental  (+) Partial Lower, Missing   Pulmonary former smoker (quit 2018)   Pulmonary exam normal breath sounds clear to auscultation       Cardiovascular hypertension, Normal cardiovascular exam Rhythm:Regular Rate:Normal  ECG 10/13/22: normal   Neuro/Psych HOH    GI/Hepatic ,GERD  ,,  Endo/Other  diabetes, Type 2    Renal/GU negative Renal ROS     Musculoskeletal   Abdominal   Peds  Hematology negative hematology ROS (+)   Anesthesia Other Findings Last dose of Ozempic 11/13/22.  Reproductive/Obstetrics                             Anesthesia Physical Anesthesia Plan  ASA: 2  Anesthesia Plan: General   Post-op Pain Management:    Induction: Intravenous  PONV Risk Score and Plan: 3 and Propofol infusion, TIVA and Treatment may vary due to age or medical condition  Airway Management Planned: Natural Airway  Additional Equipment:   Intra-op Plan:   Post-operative Plan:   Informed Consent: I have reviewed the patients History and Physical, chart, labs and discussed the procedure including the risks, benefits and alternatives for the proposed anesthesia with the patient or authorized representative who has indicated his/her understanding and acceptance.       Plan Discussed with: CRNA  Anesthesia Plan Comments: (LMA/GETA backup discussed.  Patient consented for risks of anesthesia including but not limited to:  - adverse reactions to medications - damage to eyes, teeth, lips or other oral mucosa - nerve damage due to positioning  - sore throat or hoarseness -  damage to heart, brain, nerves, lungs, other parts of body or loss of life  Informed patient about role of CRNA in peri- and intra-operative care.  Patient voiced understanding.)        Anesthesia Quick Evaluation

## 2022-12-08 ENCOUNTER — Encounter: Payer: Self-pay | Admitting: Gastroenterology

## 2022-12-11 ENCOUNTER — Telehealth: Payer: Self-pay | Admitting: Physician Assistant

## 2022-12-11 LAB — SURGICAL PATHOLOGY

## 2022-12-11 NOTE — Telephone Encounter (Signed)
The coordinator called in to get the procedure notes and the labs to be faxed to the office.

## 2022-12-20 ENCOUNTER — Encounter: Payer: Self-pay | Admitting: Gastroenterology
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
## 2022-12-29 ENCOUNTER — Encounter: Payer: Self-pay | Admitting: Physician Assistant

## 2022-12-29 ENCOUNTER — Ambulatory Visit (INDEPENDENT_AMBULATORY_CARE_PROVIDER_SITE_OTHER): Payer: Medicaid Other | Admitting: Physician Assistant

## 2022-12-29 VITALS — BP 149/111 | HR 73 | Temp 97.7°F | Ht 63.0 in | Wt 154.0 lb

## 2022-12-29 DIAGNOSIS — K760 Fatty (change of) liver, not elsewhere classified: Secondary | ICD-10-CM | POA: Diagnosis not present

## 2022-12-29 DIAGNOSIS — Z8719 Personal history of other diseases of the digestive system: Secondary | ICD-10-CM

## 2022-12-29 DIAGNOSIS — K5909 Other constipation: Secondary | ICD-10-CM | POA: Diagnosis not present

## 2022-12-29 DIAGNOSIS — K648 Other hemorrhoids: Secondary | ICD-10-CM

## 2022-12-29 DIAGNOSIS — Z860101 Personal history of adenomatous and serrated colon polyps: Secondary | ICD-10-CM

## 2022-12-29 DIAGNOSIS — Z800 Family history of malignant neoplasm of digestive organs: Secondary | ICD-10-CM

## 2022-12-29 DIAGNOSIS — K649 Unspecified hemorrhoids: Secondary | ICD-10-CM

## 2022-12-29 DIAGNOSIS — K5904 Chronic idiopathic constipation: Secondary | ICD-10-CM

## 2022-12-29 DIAGNOSIS — Z860100 Personal history of colon polyps, unspecified: Secondary | ICD-10-CM

## 2022-12-29 MED ORDER — HYDROCORTISONE ACETATE 25 MG RE SUPP
25.0000 mg | Freq: Every day | RECTAL | 1 refills | Status: AC
Start: 2022-12-29 — End: 2023-01-22

## 2023-01-24 ENCOUNTER — Ambulatory Visit: Payer: Medicare HMO | Admitting: Surgery

## 2023-02-12 ENCOUNTER — Ambulatory Visit: Payer: Medicare HMO | Admitting: Surgery

## 2024-04-10 IMAGING — MR MR THORACIC SPINE W/O CM
6 series · 43 of 48 positions shown · non-contrast
Comparison: None

CLINICAL DATA: Chronic thoracic region back pain. Fall in 9054. No
new injury.

EXAM:
MRI THORACIC SPINE WITHOUT CONTRAST
TECHNIQUE: Multiplanar, multisequence MR imaging of the thoracic spine was
performed. No intravenous contrast was administered.

[Series 3: T1 · sagittal · 4.0mm · 1.12mm/px · 3 of 12 slices shown (1 of 2)]
[im 1/12]
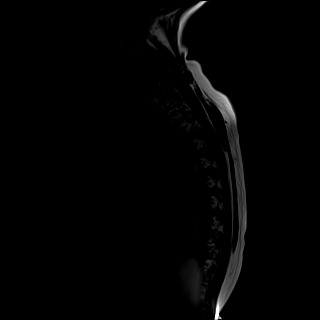
[im 6/12]
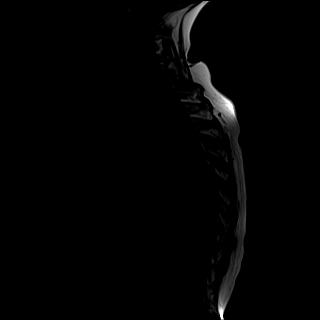
[im 12/12]
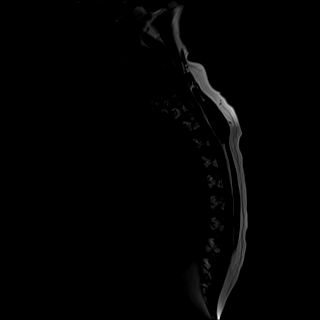

[Series 4: T2 · sagittal · 3.0mm · 1.06mm/px · 5 of 17 slices shown (1 of 2)]
[im 1/17]
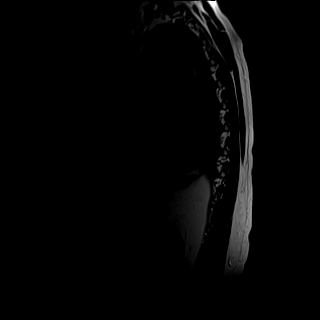
[im 5/17]
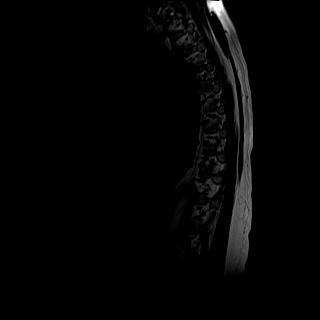
[im 9/17]
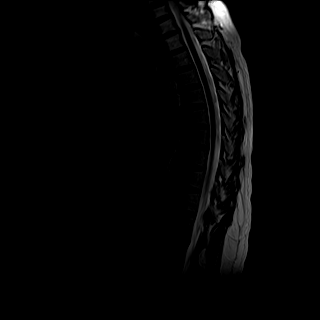
[im 13/17]
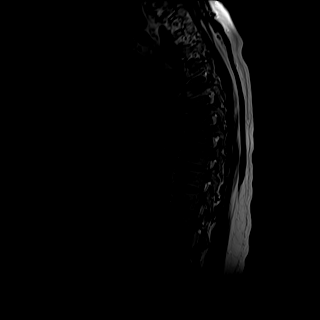
[im 17/17]
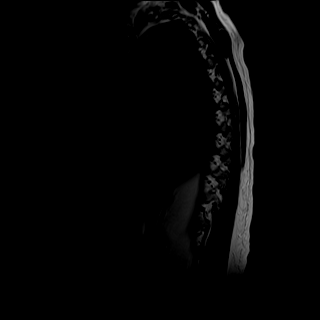

[Series 5: T1 · sagittal · 3.0mm · 1.33mm/px · 5 of 17 slices shown (2 of 2)]
[im 1/17]
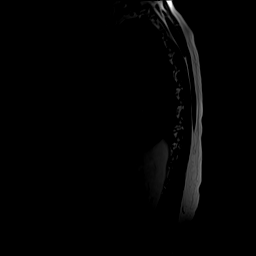
[im 5/17]
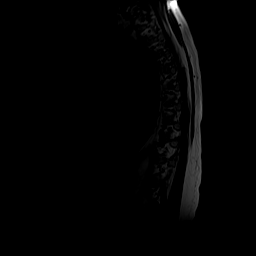
[im 9/17]
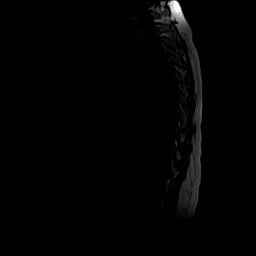
[im 13/17]
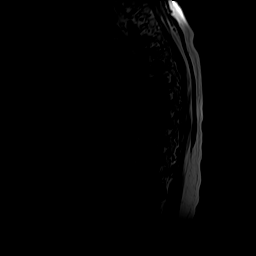
[im 17/17]
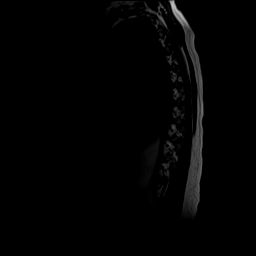

[Series 6: STIR · sagittal · 3.0mm · 1.06mm/px · 5 of 17 slices shown]
[im 1/17]
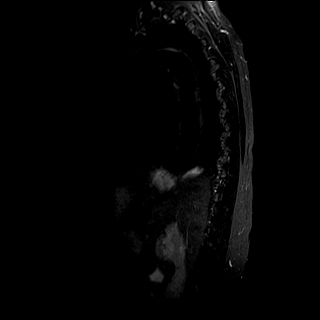
[im 5/17]
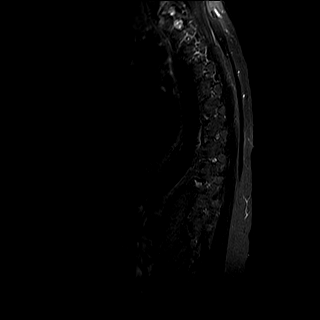
[im 9/17]
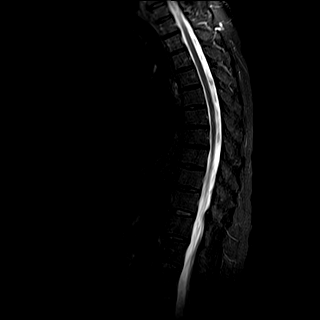
[im 13/17]
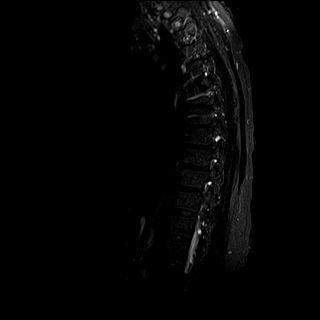
[im 17/17]
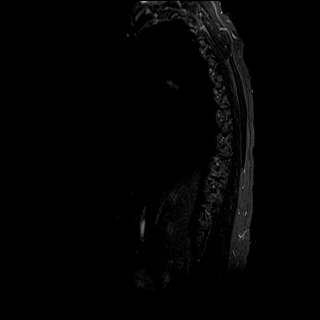

[Series 100: T2 · axial · 5.0mm · 0.78mm/px · z∈[-264,-4]mm · 15 of 52 slices shown (2 of 2)]
[im 1/52]
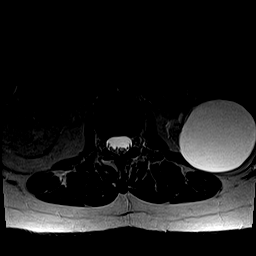
[im 4/52]
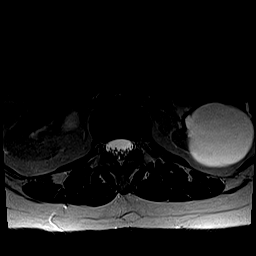
[im 8/52]
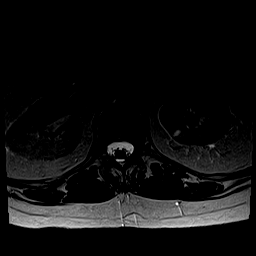
[im 11/52]
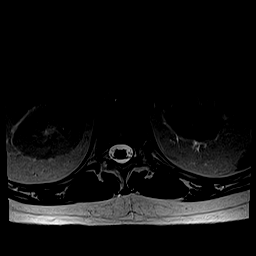
[im 15/52]
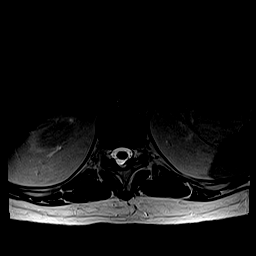
[im 19/52]
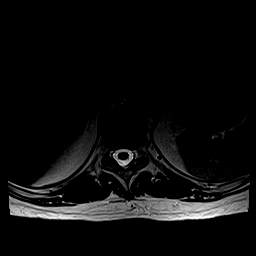
[im 22/52]
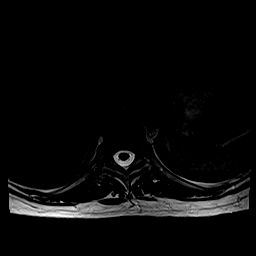
[im 26/52]
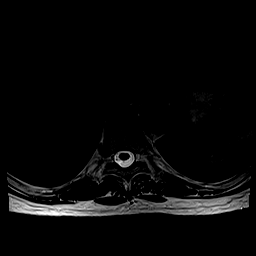
[im 30/52]
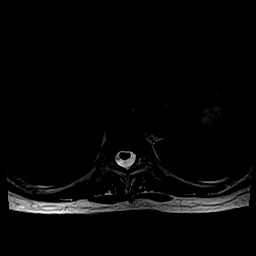
[im 33/52]
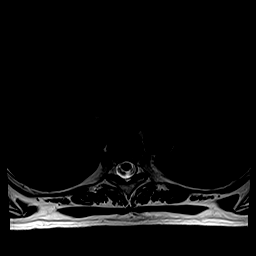
[im 37/52]
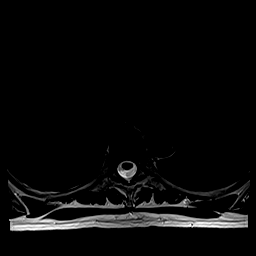
[im 41/52]
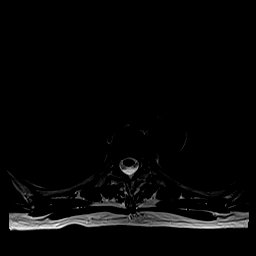
[im 44/52]
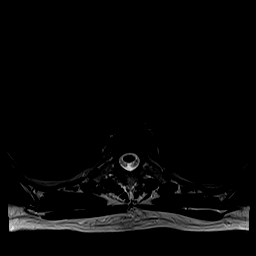
[im 48/52]
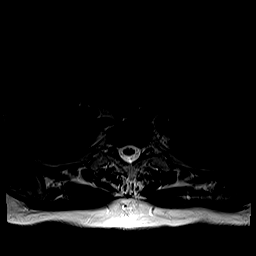
[im 52/52]
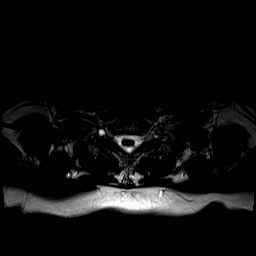

[Series 101: GRE · axial · 5.0mm · 0.39mm/px · z∈[-268,+1]mm · 10 of 52 slices shown]
[im 1/52]
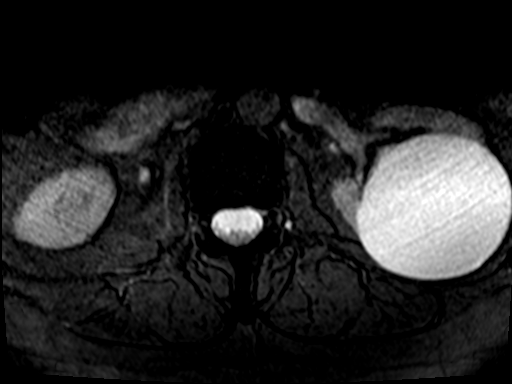
[im 4/52]
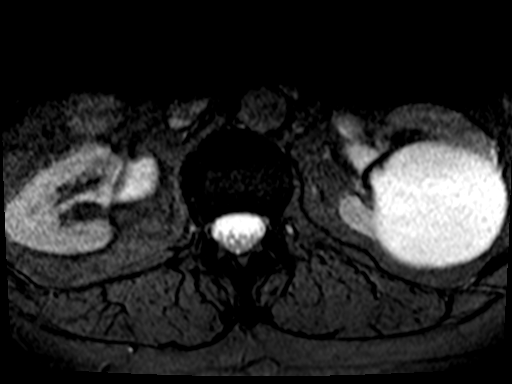
[im 8/52]
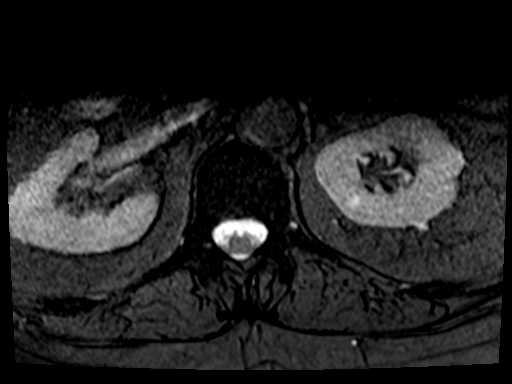
[im 11/52]
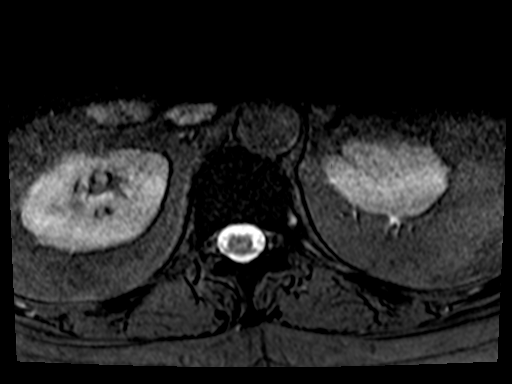
[im 15/52]
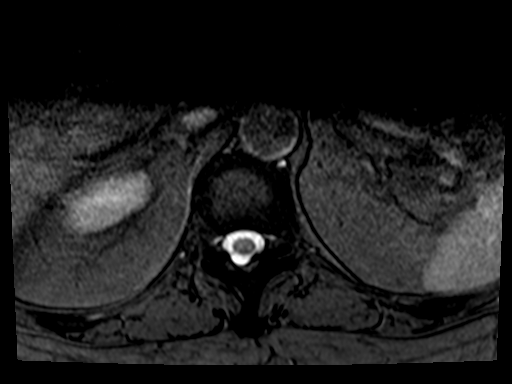
[im 22/52]
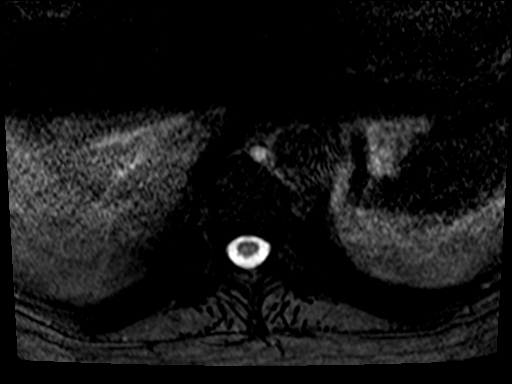
[im 30/52]
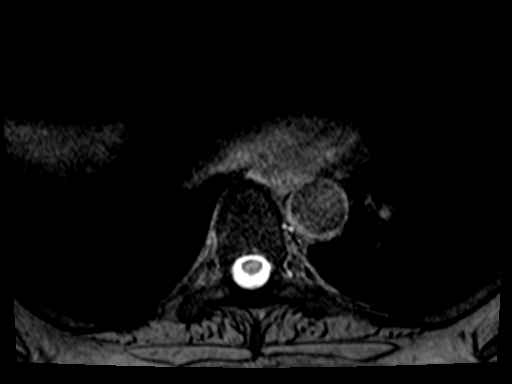
[im 37/52]
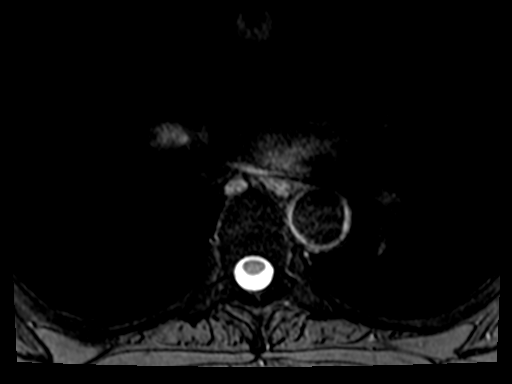
[im 44/52]
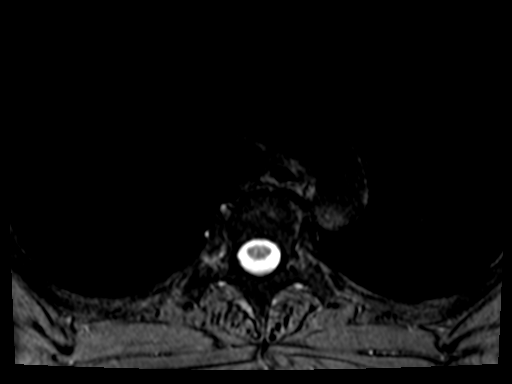
[im 52/52]
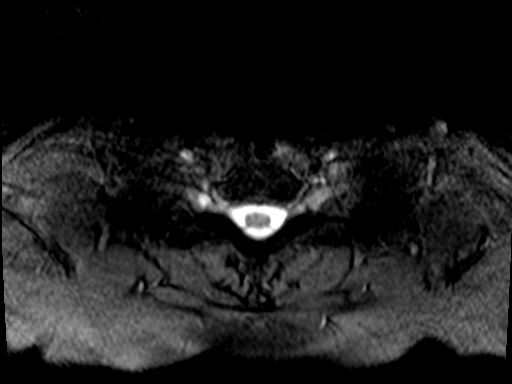

[43 of 48 positions shown; findings below may reference images not displayed]

FINDINGS: Alignment:  No malalignment.

Vertebrae: No old or recent fracture or focal thoracic region bone
abnormality.

Cord:  No cord compression or focal cord lesion.

Paraspinal and other soft tissues: Negative

Disc levels:

No disc level abnormality from T1-2 through T5-6.

T6-7: Small right paracentral disc protrusion without neural
compression.

T7-8: Small central disc protrusion without neural compression.

T8-9: Small central disc protrusion without neural compression.

T9-10 and T10-11: No disc abnormality. Mild facet osteoarthritis. No
stenosis.

T11-12: Mild noncompressive disc bulge.

T12-L1: Normal.
IMPRESSION: No old or acute fracture or focal bone lesion.

Non-compressive disc protrusions from T6-7 through T8-9.

Ordinary mild lower thoracic facet osteoarthritis without edematous
change or neural encroachment.
